# Patient Record
Sex: Female | Born: 1955 | Race: White | Hispanic: No | Marital: Married | Smoking: Never smoker
Health system: Southern US, Community
[De-identification: ages and names within clinical notes are randomized; demographics above are authoritative.]

## PROBLEM LIST (undated history)

## (undated) DIAGNOSIS — Z5189 Encounter for other specified aftercare: Secondary | ICD-10-CM

## (undated) DIAGNOSIS — I10 Essential (primary) hypertension: Secondary | ICD-10-CM

## (undated) DIAGNOSIS — E785 Hyperlipidemia, unspecified: Secondary | ICD-10-CM

## (undated) HISTORY — PX: HEMORROIDECTOMY: SUR656

## (undated) HISTORY — DX: Essential (primary) hypertension: I10

## (undated) HISTORY — DX: Hyperlipidemia, unspecified: E78.5

## (undated) HISTORY — DX: Encounter for other specified aftercare: Z51.89

---

## 1979-09-25 HISTORY — PX: ABDOMINAL HYSTERECTOMY: SHX81

## 2013-09-24 HISTORY — PX: LAPAROSCOPIC GASTRIC SLEEVE RESECTION: SHX5895

## 2016-09-24 HISTORY — PX: BREAST CYST ASPIRATION: SHX578

## 2020-03-11 LAB — HM MAMMOGRAPHY

## 2020-03-23 LAB — BASIC METABOLIC PANEL
BUN: 11 (ref 4–21)
CO2: 27 — AB (ref 13–22)
Chloride: 103 (ref 99–108)
Creatinine: 0.7 (ref 0.5–1.1)
Glucose: 84
Potassium: 4.4 (ref 3.4–5.3)
Sodium: 138 (ref 137–147)

## 2020-03-23 LAB — COMPREHENSIVE METABOLIC PANEL
Albumin: 4.1 (ref 3.5–5.0)
Calcium: 9.3 (ref 8.7–10.7)
GFR calc Af Amer: 108
GFR calc non Af Amer: 93
Globulin: 3.3

## 2020-03-23 LAB — CBC AND DIFFERENTIAL
HCT: 43 (ref 36–46)
Hemoglobin: 14.4 (ref 12.0–16.0)
WBC: 6.7

## 2020-03-23 LAB — HEPATIC FUNCTION PANEL
ALT: 23 (ref 7–35)
AST: 25 (ref 13–35)
Alkaline Phosphatase: 86 (ref 25–125)
Bilirubin, Total: 0.6

## 2020-03-23 LAB — VITAMIN B12: Vitamin B-12: 277

## 2020-03-23 LAB — TSH: TSH: 1.31 (ref 0.41–5.90)

## 2020-03-23 LAB — CBC: RBC: 4.37 (ref 3.87–5.11)

## 2020-03-23 LAB — VITAMIN D 25 HYDROXY (VIT D DEFICIENCY, FRACTURES): Vit D, 25-Hydroxy: 29

## 2020-03-24 LAB — LIPID PANEL
Cholesterol: 239 — AB (ref 0–200)
LDL Cholesterol: 145
LDl/HDL Ratio: 4.1
Triglycerides: 224 — AB (ref 40–160)

## 2020-09-06 DIAGNOSIS — Z20822 Contact with and (suspected) exposure to covid-19: Secondary | ICD-10-CM | POA: Diagnosis not present

## 2020-09-10 DIAGNOSIS — L2389 Allergic contact dermatitis due to other agents: Secondary | ICD-10-CM | POA: Diagnosis not present

## 2020-09-10 DIAGNOSIS — I10 Essential (primary) hypertension: Secondary | ICD-10-CM | POA: Diagnosis not present

## 2020-09-10 DIAGNOSIS — E785 Hyperlipidemia, unspecified: Secondary | ICD-10-CM | POA: Diagnosis not present

## 2020-09-15 ENCOUNTER — Encounter: Payer: Self-pay | Admitting: Family Medicine

## 2020-09-15 ENCOUNTER — Ambulatory Visit (INDEPENDENT_AMBULATORY_CARE_PROVIDER_SITE_OTHER): Payer: Medicare HMO | Admitting: Family Medicine

## 2020-09-15 ENCOUNTER — Other Ambulatory Visit: Payer: Self-pay

## 2020-09-15 VITALS — BP 168/98 | HR 90 | Temp 97.4°F | Ht 63.75 in | Wt 201.2 lb

## 2020-09-15 DIAGNOSIS — E782 Mixed hyperlipidemia: Secondary | ICD-10-CM | POA: Diagnosis not present

## 2020-09-15 DIAGNOSIS — L309 Dermatitis, unspecified: Secondary | ICD-10-CM | POA: Insufficient documentation

## 2020-09-15 DIAGNOSIS — I10 Essential (primary) hypertension: Secondary | ICD-10-CM | POA: Diagnosis not present

## 2020-09-15 DIAGNOSIS — Z1159 Encounter for screening for other viral diseases: Secondary | ICD-10-CM

## 2020-09-15 DIAGNOSIS — Z114 Encounter for screening for human immunodeficiency virus [HIV]: Secondary | ICD-10-CM

## 2020-09-15 DIAGNOSIS — E785 Hyperlipidemia, unspecified: Secondary | ICD-10-CM | POA: Insufficient documentation

## 2020-09-15 MED ORDER — LOSARTAN POTASSIUM 50 MG PO TABS: 50.0000 mg | ORAL_TABLET | Freq: Every day | ORAL | 3 refills | Status: DC

## 2020-09-15 MED ORDER — TRIAMCINOLONE ACETONIDE 0.1 % EX CREA
TOPICAL_CREAM | Freq: Two times a day (BID) | CUTANEOUS | 1 refills | Status: AC
Start: 2020-09-15 — End: 2020-09-30

## 2020-09-15 MED ORDER — ATORVASTATIN CALCIUM 20 MG PO TABS: 20.0000 mg | ORAL_TABLET | Freq: Every day | ORAL | 3 refills | Status: DC

## 2020-09-15 NOTE — Progress Notes (Signed)
Subjective:     Evelyn Bell is a 64 y.o. female presenting for Establish Care     HPI   #Rash - [facility] - thought it was shingles - started Dec 11 - went to [facility] on Dec 18 - one small welt and spread everywhere - took oral steroids  - using topical steroids - triamcinolone - was itchy and prescribed hydroxyzine   #HTN - has been out of medications x 2 months - feeling fine - no cp, ha, dizziness, vision changes - before she ran out of medication was running 145/84 - restarted Losartan 50 mg last week -     Review of Systems   Social History   Tobacco Use  Smoking Status Never Smoker  Smokeless Tobacco Never Used        Objective:    BP Readings from Last 3 Encounters:  09/15/20 (!) 168/98   Wt Readings from Last 3 Encounters:  09/15/20 201 lb 4 oz (91.3 kg)    BP (!) 168/98   Pulse 90   Temp (!) 97.4 F (36.3 C) (Temporal)   Ht 5' 3.75" (1.619 m)   Wt 201 lb 4 oz (91.3 kg)   SpO2 99%   BMI 34.82 kg/m    Physical Exam Constitutional:      General: She is not in acute distress.    Appearance: She is well-developed. She is not diaphoretic.  HENT:     Right Ear: External ear normal.     Left Ear: External ear normal.     Nose: Nose normal.  Eyes:     Conjunctiva/sclera: Conjunctivae normal.  Cardiovascular:     Rate and Rhythm: Normal rate and regular rhythm.     Heart sounds: No murmur heard.   Pulmonary:     Effort: Pulmonary effort is normal. No respiratory distress.     Breath sounds: Normal breath sounds. No wheezing.  Musculoskeletal:     Cervical back: Neck supple.  Skin:    General: Skin is warm and dry.     Capillary Refill: Capillary refill takes less than 2 seconds.     Comments: Several erythematous macular lesions on the trunk.   Neurological:     Mental Status: She is alert. Mental status is at baseline.  Psychiatric:        Mood and Affect: Mood normal.        Behavior: Behavior normal.            Assessment & Plan:   Problem List Items Addressed This Visit      Cardiovascular and Mediastinum   Hypertension - Primary    BP elevated in setting of steroids and being out of medication. Home monitoring off steroids and mychart in 2 weeks. Anticipate increasing losartan and possibly adding amlodipine if needed. Blood work fasting next week. Cont losartan 50 mg.       Relevant Medications   atorvastatin (LIPITOR) 20 MG tablet   losartan (COZAAR) 50 MG tablet   Other Relevant Orders   Comprehensive metabolic panel   TSH   CBC     Musculoskeletal and Integument   Dermatitis    Resolving dermatitis. Refill triamcinolone if needed.       Relevant Medications   triamcinolone (KENALOG) 0.1 %     Other   Hyperlipidemia    Cont atorvastatin 20 mg. Return for fasting labs.       Relevant Medications   atorvastatin (LIPITOR) 20 MG tablet  losartan (COZAAR) 50 MG tablet   Other Relevant Orders   Lipid panel    Other Visit Diagnoses    Need for hepatitis C screening test       Relevant Orders   Hepatitis C antibody   Screening for HIV (human immunodeficiency virus)       Relevant Orders   HIV Antibody (routine testing w rflx)       Return in about 2 months (around 11/16/2020).  Lynnda Child, MD  This visit occurred during the SARS-CoV-2 public health emergency.  Safety protocols were in place, including screening questions prior to the visit, additional usage of staff PPE, and extensive cleaning of exam room while observing appropriate contact time as indicated for disinfecting solutions.

## 2020-09-15 NOTE — Assessment & Plan Note (Signed)
Cont atorvastatin 20 mg. Return for fasting labs.

## 2020-09-15 NOTE — Assessment & Plan Note (Signed)
Resolving dermatitis. Refill triamcinolone if needed.

## 2020-09-15 NOTE — Assessment & Plan Note (Addendum)
BP elevated in setting of steroids and being out of medication. Home monitoring off steroids and mychart in 2 weeks. Anticipate increasing losartan and possibly adding amlodipine if needed. Blood work fasting next week. Cont losartan 50 mg.

## 2020-09-15 NOTE — Patient Instructions (Addendum)
#   Rash - continue current treatment - if rash flares up - try topical for 1 week week and if no improvement make follow-up appointment  #Blood pressure - Continue losartan - Mychart in 2 weeks with blood pressure readings    Your blood pressure high.   High blood pressure increases your risk for heart attack and stroke.    Please check your blood pressure 2-4 times a week.   To check your blood pressure 1) Sit in a quiet and relaxed place for 5 minutes 2) Make sure your feet are flat on the ground 3) Consider checking first thing in the morning   Normal blood pressure is less than 140/90 Ideally you blood pressure should be around 120/80  Other ways you can reduce your blood pressure:  1) Regular exercise -- Try to get 150 minutes (30 minutes, 5 days a week) of moderate to vigorous aerobic excercise -- Examples: brisk walking (2.5 miles per hour), water aerobics, dancing, gardening, tennis, biking slower than 10 miles per hour 2) DASH Diet - low fat meats, more fresh fruits and vegetables, whole grains, low salt 3) Quit smoking if you smoke 4) Loose 5-10% of your body weight

## 2020-09-29 ENCOUNTER — Other Ambulatory Visit: Payer: Self-pay

## 2020-09-29 ENCOUNTER — Other Ambulatory Visit (INDEPENDENT_AMBULATORY_CARE_PROVIDER_SITE_OTHER): Payer: Medicare HMO

## 2020-09-29 DIAGNOSIS — Z1159 Encounter for screening for other viral diseases: Secondary | ICD-10-CM

## 2020-09-29 DIAGNOSIS — Z114 Encounter for screening for human immunodeficiency virus [HIV]: Secondary | ICD-10-CM

## 2020-09-29 DIAGNOSIS — I10 Essential (primary) hypertension: Secondary | ICD-10-CM | POA: Diagnosis not present

## 2020-09-29 DIAGNOSIS — E782 Mixed hyperlipidemia: Secondary | ICD-10-CM | POA: Diagnosis not present

## 2020-09-29 LAB — CBC
HCT: 42.7 % (ref 36.0–46.0)
Hemoglobin: 14.7 g/dL (ref 12.0–15.0)
MCHC: 34.3 g/dL (ref 30.0–36.0)
MCV: 95.9 fl (ref 78.0–100.0)
Platelets: 216 10*3/uL (ref 150.0–400.0)
RBC: 4.45 Mil/uL (ref 3.87–5.11)
RDW: 14.4 % (ref 11.5–15.5)
WBC: 6.4 10*3/uL (ref 4.0–10.5)

## 2020-09-29 LAB — COMPREHENSIVE METABOLIC PANEL
ALT: 40 U/L — ABNORMAL HIGH (ref 0–35)
AST: 34 U/L (ref 0–37)
Albumin: 4.2 g/dL (ref 3.5–5.2)
Alkaline Phosphatase: 76 U/L (ref 39–117)
BUN: 9 mg/dL (ref 6–23)
CO2: 28 mEq/L (ref 19–32)
Calcium: 9.7 mg/dL (ref 8.4–10.5)
Chloride: 104 mEq/L (ref 96–112)
Creatinine, Ser: 0.69 mg/dL (ref 0.40–1.20)
GFR: 91.32 mL/min (ref >60.00)
Glucose, Bld: 97 mg/dL (ref 70–99)
Potassium: 4.2 mEq/L (ref 3.5–5.1)
Sodium: 138 mEq/L (ref 135–145)
Total Bilirubin: 0.8 mg/dL (ref 0.2–1.2)
Total Protein: 7.2 g/dL (ref 6.0–8.3)

## 2020-09-29 LAB — LIPID PANEL
Cholesterol: 224 mg/dL — ABNORMAL HIGH (ref 0–200)
HDL: 45.5 mg/dL (ref >39.00)
LDL Cholesterol: 147 mg/dL — ABNORMAL HIGH (ref 0–99)
NonHDL: 178.21
Total CHOL/HDL Ratio: 5
Triglycerides: 157 mg/dL — ABNORMAL HIGH (ref 0.0–149.0)
VLDL: 31.4 mg/dL (ref 0.0–40.0)

## 2020-09-29 LAB — TSH: TSH: 2.33 u[IU]/mL (ref 0.35–4.50)

## 2020-09-30 ENCOUNTER — Encounter: Payer: Self-pay | Admitting: Family Medicine

## 2020-09-30 LAB — HEPATITIS C ANTIBODY
Hepatitis C Ab: NONREACTIVE
SIGNAL TO CUT-OFF: 0.01 (ref <1.00)

## 2020-09-30 LAB — HIV ANTIBODY (ROUTINE TESTING W REFLEX): HIV 1&2 Ab, 4th Generation: NONREACTIVE

## 2020-11-21 ENCOUNTER — Ambulatory Visit (INDEPENDENT_AMBULATORY_CARE_PROVIDER_SITE_OTHER): Payer: Medicare HMO | Admitting: Family Medicine

## 2020-11-21 ENCOUNTER — Other Ambulatory Visit: Payer: Self-pay

## 2020-11-21 ENCOUNTER — Encounter: Payer: Self-pay | Admitting: Family Medicine

## 2020-11-21 VITALS — BP 130/90 | HR 81 | Temp 97.6°F | Ht 64.0 in | Wt 204.5 lb

## 2020-11-21 DIAGNOSIS — E782 Mixed hyperlipidemia: Secondary | ICD-10-CM

## 2020-11-21 DIAGNOSIS — L309 Dermatitis, unspecified: Secondary | ICD-10-CM

## 2020-11-21 DIAGNOSIS — I10 Essential (primary) hypertension: Secondary | ICD-10-CM | POA: Insufficient documentation

## 2020-11-21 NOTE — Assessment & Plan Note (Signed)
Good control at home 120-130s/84. Suspect whitecoat HTN. Cont losartan 50 mg.

## 2020-11-21 NOTE — Patient Instructions (Addendum)
Rash - continue triamcinolone cream - can use for 2-3 weeks - could try Clobetasol if not responding - dermatology referral  #Referral I have placed a referral to a specialist for you. You should receive a phone call from the specialty office. Make sure your voicemail is not full and that if you are able to answer your phone to unknown or new numbers.   It may take up to 2 weeks to hear about the referral. If you do not hear anything in 2 weeks, please call our office and ask to speak with the referral coordinator.

## 2020-11-21 NOTE — Assessment & Plan Note (Signed)
Recently restarted lipitor 20mg . Will recheck labs at next visit.

## 2020-11-21 NOTE — Assessment & Plan Note (Signed)
Cont current medications given good home bp.

## 2020-11-21 NOTE — Assessment & Plan Note (Signed)
Continue triamcinolone. Previously needed steroids but minimal lesions currently. Due to recurrence w/o cause will refer to dermatology for support if not resolving with topical treatment.

## 2020-11-21 NOTE — Progress Notes (Signed)
Subjective:     Evelyn Bell is a 65 y.o. female presenting for Follow-up (2 month-HT ) and Rash (Upper body )     HPI  #HTN - has been checking at home 120-130/84 - is taking losartan - no ha/vision changes   #HLD - had been out of cholesterol medication - tolerating lipitor  #Rash - has returned - is "all over"  - arms, face, abdomen -   Review of Systems  09/15/2020: Clinic - HTN on steroids and out of meds. Home monitoring 09/30/2020: MyChart - improved on current medications  Social History   Tobacco Use  Smoking Status Never Smoker  Smokeless Tobacco Never Used        Objective:    BP Readings from Last 3 Encounters:  11/21/20 130/90  09/15/20 (!) 168/98   Wt Readings from Last 3 Encounters:  11/21/20 204 lb 8 oz (92.8 kg)  09/15/20 201 lb 4 oz (91.3 kg)    BP 130/90   Pulse 81   Temp 97.6 F (36.4 C) (Temporal)   Ht 5\' 4"  (1.626 m)   Wt 204 lb 8 oz (92.8 kg)   SpO2 99%   BMI 35.10 kg/m    Physical Exam Constitutional:      General: She is not in acute distress.    Appearance: She is well-developed. She is not diaphoretic.  HENT:     Right Ear: External ear normal.     Left Ear: External ear normal.  Eyes:     Conjunctiva/sclera: Conjunctivae normal.  Cardiovascular:     Rate and Rhythm: Normal rate and regular rhythm.     Heart sounds: No murmur heard.   Pulmonary:     Effort: Pulmonary effort is normal. No respiratory distress.     Breath sounds: Normal breath sounds. No wheezing.  Musculoskeletal:     Cervical back: Neck supple.  Skin:    General: Skin is warm and dry.     Capillary Refill: Capillary refill takes less than 2 seconds.     Comments: Raised, erythematous plaques with scale on the abdomen and some scattered papules on the arms.   Neurological:     Mental Status: She is alert. Mental status is at baseline.  Psychiatric:        Mood and Affect: Mood normal.        Behavior: Behavior normal.            Assessment & Plan:   Problem List Items Addressed This Visit      Cardiovascular and Mediastinum   Hypertension - Primary    Good control at home 120-130s/84. Suspect whitecoat HTN. Cont losartan 50 mg.       White coat syndrome with diagnosis of hypertension    Cont current medications given good home bp.         Musculoskeletal and Integument   Dermatitis    Continue triamcinolone. Previously needed steroids but minimal lesions currently. Due to recurrence w/o cause will refer to dermatology for support if not resolving with topical treatment.       Relevant Orders   Ambulatory referral to Dermatology     Other   Hyperlipidemia    Recently restarted lipitor 20mg . Will recheck labs at next visit.          Return in about 6 months (around 05/21/2021) for welcome to medicare.  , MD  This visit occurred during the SARS-CoV-2 public health emergency.  Safety protocols were in place,  including screening questions prior to the visit, additional usage of staff PPE, and extensive cleaning of exam room while observing appropriate contact time as indicated for disinfecting solutions.

## 2020-12-12 ENCOUNTER — Ambulatory Visit: Payer: Medicare HMO | Admitting: Dermatology

## 2020-12-12 ENCOUNTER — Other Ambulatory Visit: Payer: Self-pay

## 2020-12-12 DIAGNOSIS — L509 Urticaria, unspecified: Secondary | ICD-10-CM

## 2020-12-12 DIAGNOSIS — R232 Flushing: Secondary | ICD-10-CM | POA: Diagnosis not present

## 2020-12-12 NOTE — Progress Notes (Signed)
   New Patient Visit  Subjective  Evelyn Bell is a 65 y.o. female who presents for the following: Dermatitis (Patient moved here in October patient states in December 11 she noticed a welt on left hip area and then spread to bilateral hip and waist area. Patient states she has had rash on chest, hands, arms, waist, thigh, abdomen and inguinal folds. Patient states she has been to urgent care and was given pill and triamcinolone cream 0.1 %. Patient states cream only helps on welt. She states rash last for about 2 weeks and comes back. ).  Patient denies using any new foods, meds, pets, detergents, creams, and lotion  Objective  Well appearing patient in no apparent distress; mood and affect are within normal limits.  A focused examination was performed including chest and abdomen. Relevant physical exam findings are noted in the Assessment and Plan.  Objective  abdomen: Clear today   Objective  chest: Redness and flush   Assessment & Plan  Hives /urticaria abdomen May be persistent or recurrent.  Patient is currently clear today.  She has had only a few episodes over the last few months.    Pamphlet on hives/urticaria given in office   Discussed factors that can cause hives Hives are generally idiopathic; possibly related to stress, infection, medications, foods, sun, heat, cold or viruses.  Advised that 70% of the time we do not find out the exact cause.  Reviewed patient's labs:  tsh, cmp, cbc, and lipids. All normal except elevated cholesterol.  I do not recommend any further laboratory at this time.  Discussed recommended treatment options as Allergra or Claritin. Sometimes we will add oral Singulair or systemic if needed. We can also consider Xolair injections if resistant to other treatment options. Because she is currently clear would recommend starting with recurrence.  No treatment today - patient is not currently experiencing rash  Recommend starting if  rash/hives reoccur allegra 180 mg - 1 tab qd if symptoms still persist can increase to 2 tablets qd. If hive/rash still unresolved call and schedule an appointment.   Flushing chest No treatment recommended at this time. May consider Rhofade or Mirvaso in the future.  Return if symptoms worsen or fail to improve.  IAsher Muir, CMA, am acting as scribe for Armida Sans, MD.  Documentation: I have reviewed the above documentation for accuracy and completeness, and I agree with the above.  Armida Sans, MD

## 2020-12-12 NOTE — Patient Instructions (Addendum)
If hive/rash reoccurs start Allegra 180 mg take 1 by mouth daily. If symptoms still persist increase to 2 tablets by mouth daily. If symptoms still persist call and schedule an appointment.   If you have any questions or concerns for your doctor, please call our main line at [phone] and press option 4 to reach your doctor's medical assistant. If no one answers, please leave a voicemail as directed and we will return your call as soon as possible. Messages left after 4 pm will be answered the following business day.   You may also send [place] a message via MyChart. We typically respond to MyChart messages within 1-2 business days.  For prescription refills, please ask your pharmacy to contact our office. Our fax number is [phone].  If you have an urgent issue when the clinic is closed that cannot wait until the next business day, you can page your doctor at the number below.    Please note that while we do our best to be available for urgent issues outside of office hours, we are not available 24/7.   If you have an urgent issue and are unable to reach [place], you may choose to seek medical care at your doctor's office, retail clinic, urgent care center, or emergency room.  If you have a medical emergency, please immediately call 911 or go to the emergency department.  Pager Numbers  - Dr. Gwen Pounds: [phone]  - Dr. Neale Burly: [phone]  - Dr. Roseanne Reno: [phone]  In the event of inclement weather, please call our main line at [phone] for an update on the status of any delays or closures.  Dermatology Medication Tips: Please keep the boxes that topical medications come in in order to help keep track of the instructions about where and how to use these. Pharmacies typically print the medication instructions only on the boxes and not directly on the medication tubes.   If your medication is too expensive, please contact our office at [phone] option 4 or send [place] a message  through MyChart.   We are unable to tell what your co-pay for medications will be in advance as this is different depending on your insurance coverage. However, we may be able to find a substitute medication at lower cost or fill out paperwork to get insurance to cover a needed medication.   If a prior authorization is required to get your medication covered by your insurance company, please allow [place] 1-2 business days to complete this process.  Drug prices often vary depending on where the prescription is filled and some pharmacies may offer cheaper prices.  The website www.goodrx.com contains coupons for medications through different pharmacies. The prices here do not account for what the cost may be with help from insurance (it may be cheaper with your insurance), but the website can give you the price if you did not use any insurance.  - You can print the associated coupon and take it with your prescription to the pharmacy.  - You may also stop by our office during regular business hours and pick up a GoodRx coupon card.  - If you need your prescription sent electronically to a different pharmacy, notify our office through [facility] or by phone at [phone] option 4.

## 2020-12-14 ENCOUNTER — Encounter: Payer: Self-pay | Admitting: Dermatology

## 2021-01-19 ENCOUNTER — Telehealth: Payer: Self-pay

## 2021-01-19 NOTE — Telephone Encounter (Signed)
Patient called regarding rash that Dr. Gwen Pounds seen last month at office visit. Patient states rash is now back in full force. She is unsure what to do at this point. Please advise.

## 2021-01-19 NOTE — Telephone Encounter (Signed)
Pt with history of HIVES.   She may start Allegra 180 mg qd.  If not improved in 2 days, take Allegra 180 mg TWO pills daily. Allegra is non-prescription. If still not doing well after a week, we can send in Rx for Singulair - and pt needs appt scheduled at same time. Rx Xolair shots an option is still not doing well. May do lab with Alpha-Gal also.  Pts other lab done were OK.  Please call and advise pt.

## 2021-01-20 NOTE — Telephone Encounter (Signed)
Patient advised of information per Dr. Gwen Pounds. She started taking Allegra twice daily yesterday (Thursday) and will continue through the weekend. Dr. Gwen Pounds had opening on Monday at 8:45. Patient is scheduled to come in then.

## 2021-01-23 ENCOUNTER — Other Ambulatory Visit: Payer: Self-pay

## 2021-01-23 ENCOUNTER — Ambulatory Visit: Payer: Medicare HMO | Admitting: Dermatology

## 2021-01-23 DIAGNOSIS — L509 Urticaria, unspecified: Secondary | ICD-10-CM

## 2021-01-23 DIAGNOSIS — L501 Idiopathic urticaria: Secondary | ICD-10-CM

## 2021-01-23 MED ORDER — MONTELUKAST SODIUM 10 MG PO TABS: 10.0000 mg | ORAL_TABLET | Freq: Every day | ORAL | 1 refills | Status: DC

## 2021-01-23 NOTE — Patient Instructions (Signed)

## 2021-01-23 NOTE — Progress Notes (Signed)
   Follow-Up Visit   Subjective  Evelyn Bell is a 65 y.o. female who presents for the following: Rash (Started at least 6 weeks ago, but flared  6 days ago - patient used TMC 0.1% cream BID x 6 days, but stopped using it because she didn't see an improvement in condition. Currently she is using Allegra 180mg  po BID. She has noticed that each time she gets hives she is sick prior to rash occurring.). Pt seen in March 2022 for hives, but was doing OK at the time.  Now flared. Lab reviewed from January 2022 and all OK.  The following portions of the chart were reviewed this encounter and updated as appropriate:   Tobacco  Allergies  Meds  Problems  Med Hx  Surg Hx  Fam Hx     Review of Systems:  No other skin or systemic complaints except as noted in HPI or Assessment and Plan.  Objective  Well appearing patient in no apparent distress; mood and affect are within normal limits.  A focused examination was performed including the trunk and extremities . Relevant physical exam findings are noted in the Assessment and Plan.  Objective  Trunk: Large urticarial plaques on the abdomen.  Images            Assessment & Plan  Urticaria -chronic idiopathic urticaria present for approximately 2 months flared today Trunk Chronic and persistent - patient was recently sick, discussed with patient that physical and emotional stress can be a cause of hives. We also discussed that urticaria can also have no known cause.   Continue Allegra 180mg  po BID. Start Singulair 10mg  po QD. Consider Xolair if not improving with treatment. Patient to contact our office in one week to report if condition improving with Singulair.   Recommend patient following up with her PCP to r/o sinus infection vs other sickness which could be contributing her to hives.   Lab from January 2022 OK - CBC; Chem; TSH; HIV; HepatitisC.  Order lab ordered today:  Alpha Gal IgE - urticaria  montelukast  (SINGULAIR) 10 MG tablet - Trunk  Return in about 1 month (around 02/23/2021).  , CMA, am acting as scribe for February 2022, MD .  Documentation: I have reviewed the above documentation for accuracy and completeness, and I agree with the above.  04/25/2021, MD

## 2021-01-25 ENCOUNTER — Other Ambulatory Visit: Payer: Self-pay

## 2021-01-25 ENCOUNTER — Ambulatory Visit (INDEPENDENT_AMBULATORY_CARE_PROVIDER_SITE_OTHER): Payer: Medicare HMO | Admitting: Family Medicine

## 2021-01-25 VITALS — BP 148/100 | HR 109 | Temp 97.1°F | Ht 64.0 in | Wt 210.5 lb

## 2021-01-25 DIAGNOSIS — J069 Acute upper respiratory infection, unspecified: Secondary | ICD-10-CM | POA: Diagnosis not present

## 2021-01-25 DIAGNOSIS — I10 Essential (primary) hypertension: Secondary | ICD-10-CM

## 2021-01-25 MED ORDER — BENZONATATE 100 MG PO CAPS: 100.0000 mg | ORAL_CAPSULE | Freq: Three times a day (TID) | ORAL | 0 refills | Status: DC | PRN

## 2021-01-25 NOTE — Progress Notes (Signed)
Subjective:     Evelyn Bell is a 65 y.o. female presenting for Cough (Semi productive. Home covid test 3 days ago) and Urticaria (Over most of the body )     HPI   #cough - cold symptoms - took a covid test which was negative - has had 3 episodes of hives - had resolved but came back with her cold symptoms - negative covid test 3 days ago - no loss of taste or smell - no hx of covid, vaccinated - no sick contact - coughing with rattle - endorses sinus drainage and congestion - symptoms started 4/24 with tickle in throat - on allergy medication w/o improvement  - no sinus pressure or pain - dripping and throat tickle - dry cough  #HTN - blood pressure has been normal at home   Review of Systems  Respiratory: Negative for shortness of breath and wheezing.      Social History   Tobacco Use  Smoking Status Never Smoker  Smokeless Tobacco Never Used        Objective:    BP Readings from Last 3 Encounters:  01/25/21 (!) 148/100  11/21/20 130/90  09/15/20 (!) 168/98   Wt Readings from Last 3 Encounters:  01/25/21 210 lb 8 oz (95.5 kg)  11/21/20 204 lb 8 oz (92.8 kg)  09/15/20 201 lb 4 oz (91.3 kg)    BP (!) 148/100   Pulse (!) 109   Temp (!) 97.1 F (36.2 C) (Temporal)   Ht 5\' 4"  (1.626 m)   Wt 210 lb 8 oz (95.5 kg)   SpO2 100%   BMI 36.13 kg/m    Physical Exam Constitutional:      General: She is not in acute distress.    Appearance: She is well-developed. She is not diaphoretic.  HENT:     Head: Normocephalic and atraumatic.     Right Ear: Tympanic membrane and ear canal normal.     Left Ear: Tympanic membrane and ear canal normal.     Nose: Mucosal edema and rhinorrhea present.     Right Sinus: No maxillary sinus tenderness or frontal sinus tenderness.     Left Sinus: No maxillary sinus tenderness or frontal sinus tenderness.     Mouth/Throat:     Pharynx: Uvula midline. Posterior oropharyngeal erythema present. No oropharyngeal  exudate.     Tonsils: 0 on the right. 0 on the left.  Eyes:     General: No scleral icterus.    Conjunctiva/sclera: Conjunctivae normal.  Cardiovascular:     Rate and Rhythm: Normal rate and regular rhythm.     Heart sounds: Normal heart sounds. No murmur heard.   Pulmonary:     Effort: Pulmonary effort is normal. No respiratory distress.     Breath sounds: Normal breath sounds.  Musculoskeletal:     Cervical back: Neck supple.  Lymphadenopathy:     Cervical: No cervical adenopathy.  Skin:    General: Skin is warm and dry.     Capillary Refill: Capillary refill takes less than 2 seconds.  Neurological:     Mental Status: She is alert.           Assessment & Plan:   Problem List Items Addressed This Visit      Cardiovascular and Mediastinum   Hypertension    Normal bp at home. Cont losartan 50 mg.       White coat syndrome with diagnosis of hypertension    Normal at home. Cont current  treatment       Other Visit Diagnoses    Viral URI with cough    -  Primary   Relevant Medications   benzonatate (TESSALON PERLES) 100 MG capsule     Symptomatic care for virus. Discussed cough can last up to 6 weeks. Return if fever, chills, worsening face/sinus pain.    Return if symptoms worsen or fail to improve.  Lynnda Child, MD  This visit occurred during the SARS-CoV-2 public health emergency.  Safety protocols were in place, including screening questions prior to the visit, additional usage of staff PPE, and extensive cleaning of exam room while observing appropriate contact time as indicated for disinfecting solutions.

## 2021-01-25 NOTE — Assessment & Plan Note (Signed)
Normal bp at home. Cont losartan 50 mg.

## 2021-01-25 NOTE — Assessment & Plan Note (Signed)
Normal at home. Cont current treatment

## 2021-01-25 NOTE — Patient Instructions (Signed)
Based on your symptoms, it looks like you have a virus.   Antibiotics are not need for a viral infection but the following will help:   1. Drink plenty of fluids 2. Get lots of rest  Sinus Congestion 1) Neti Pot (Saline rinse) -- 2 times day -- if tolerated 2) Flonase (Store Brand ok) - once daily 3) Over the counter congestion medications  Cough 1) Cough drops can be helpful 2) Nyquil (or nighttime cough medication) 3) Honey is proven to be one of the best cough medications  4) Cough medicine with Dextromethorphan can also be helpful  Sore Throat 1) Honey as above, cough drops 2) Ibuprofen or Aleve can be helpful 3) Salt water Gargles  If you develop fevers (Temperature >100.4), chills, worsening symptoms or symptoms lasting longer than 10 days return to clinic.    

## 2021-01-26 ENCOUNTER — Encounter: Payer: Self-pay | Admitting: Dermatology

## 2021-01-27 ENCOUNTER — Other Ambulatory Visit: Payer: Self-pay | Admitting: Dermatology

## 2021-01-27 DIAGNOSIS — L509 Urticaria, unspecified: Secondary | ICD-10-CM | POA: Diagnosis not present

## 2021-02-04 LAB — ALPHA-GAL PANEL
Allergen Lamb IgE: <0.1 kU/L
Beef IgE: <0.1 kU/L
IgE (Immunoglobulin E), Serum: 24 IU/mL (ref 6–495)
O215-IgE Alpha-Gal: <0.1 kU/L
Pork IgE: <0.1 kU/L

## 2021-02-08 ENCOUNTER — Telehealth: Payer: Self-pay

## 2021-02-08 NOTE — Telephone Encounter (Signed)
Advised patient of results/hd  

## 2021-02-08 NOTE — Telephone Encounter (Signed)
-----   Message from Deirdre Evener, MD sent at 02/06/2021  6:04 PM EDT ----- Alpha-Gal and IgE testing from May 2022 all NORMAL / Negative

## 2021-02-15 ENCOUNTER — Other Ambulatory Visit: Payer: Self-pay | Admitting: Dermatology

## 2021-02-15 DIAGNOSIS — L509 Urticaria, unspecified: Secondary | ICD-10-CM

## 2021-03-01 ENCOUNTER — Other Ambulatory Visit: Payer: Self-pay

## 2021-03-01 ENCOUNTER — Ambulatory Visit (INDEPENDENT_AMBULATORY_CARE_PROVIDER_SITE_OTHER): Payer: Medicare HMO | Admitting: Dermatology

## 2021-03-01 DIAGNOSIS — L509 Urticaria, unspecified: Secondary | ICD-10-CM | POA: Diagnosis not present

## 2021-03-01 MED ORDER — MONTELUKAST SODIUM 10 MG PO TABS: ORAL_TABLET | ORAL | 1 refills | Status: DC

## 2021-03-01 NOTE — Progress Notes (Signed)
   Follow-Up Visit   Subjective  Evelyn Bell is a 65 y.o. female who presents for the following: Follow-up (Urticaria 1 month follow up - Allegra 2 po qd, Singulair 1 po qd - has not had a flared since her last visit.).  The following portions of the chart were reviewed this encounter and updated as appropriate:   Tobacco  Allergies  Meds  Problems  Med Hx  Surg Hx  Fam Hx      Review of Systems:  No other skin or systemic complaints except as noted in HPI or Assessment and Plan.  Objective  Well appearing patient in no apparent distress; mood and affect are within normal limits.  A focused examination was performed including trunk, extremities. Relevant physical exam findings are noted in the Assessment and Plan.    Assessment & Plan  Urticaria Trunk Chronic and persistent  But better controlled without flares since last visit  Recommend she continue Allegra 2 po qd and Singulair 1 po qd for at least 2 more weeks before trying to ween off as long as she is staying clear.  Discussed labs - advised patient Alpha-Gal and IgE testing from May 2022 all NORMAL / Negative. Discussed labs for Lyme Disease - may consider in the future. May consider Xolair in the future if condition is not staying controlled.  montelukast (SINGULAIR) 10 MG tablet - Trunk TAKE 1 TABLET BY MOUTH EVERYDAY AT BEDTIME  Return if symptoms worsen or fail to improve.   I, Joanie Coddington, CMA, am acting as scribe for Armida Sans, MD .  Documentation: I have reviewed the above documentation for accuracy and completeness, and I agree with the above.  Armida Sans, MD

## 2021-03-01 NOTE — Patient Instructions (Signed)

## 2021-03-03 ENCOUNTER — Encounter: Payer: Self-pay | Admitting: Dermatology

## 2021-03-15 ENCOUNTER — Other Ambulatory Visit: Payer: Self-pay | Admitting: Dermatology

## 2021-03-15 DIAGNOSIS — L509 Urticaria, unspecified: Secondary | ICD-10-CM

## 2021-04-07 ENCOUNTER — Other Ambulatory Visit: Payer: Self-pay | Admitting: Dermatology

## 2021-04-07 DIAGNOSIS — L509 Urticaria, unspecified: Secondary | ICD-10-CM

## 2021-05-05 ENCOUNTER — Other Ambulatory Visit: Payer: Self-pay | Admitting: Dermatology

## 2021-05-05 DIAGNOSIS — L509 Urticaria, unspecified: Secondary | ICD-10-CM

## 2021-05-22 ENCOUNTER — Other Ambulatory Visit: Payer: Self-pay

## 2021-05-22 ENCOUNTER — Ambulatory Visit (INDEPENDENT_AMBULATORY_CARE_PROVIDER_SITE_OTHER): Payer: Medicare HMO | Admitting: Family Medicine

## 2021-05-22 VITALS — BP 138/82 | HR 72 | Temp 98.0°F | Ht 64.5 in | Wt 212.2 lb

## 2021-05-22 DIAGNOSIS — Z23 Encounter for immunization: Secondary | ICD-10-CM

## 2021-05-22 DIAGNOSIS — I10 Essential (primary) hypertension: Secondary | ICD-10-CM | POA: Diagnosis not present

## 2021-05-22 DIAGNOSIS — Z0000 Encounter for general adult medical examination without abnormal findings: Secondary | ICD-10-CM | POA: Diagnosis not present

## 2021-05-22 DIAGNOSIS — E782 Mixed hyperlipidemia: Secondary | ICD-10-CM | POA: Diagnosis not present

## 2021-05-22 LAB — COMPREHENSIVE METABOLIC PANEL
ALT: 24 U/L (ref 0–35)
AST: 21 U/L (ref 0–37)
Albumin: 4 g/dL (ref 3.5–5.2)
Alkaline Phosphatase: 68 U/L (ref 39–117)
BUN: 8 mg/dL (ref 6–23)
CO2: 27 mEq/L (ref 19–32)
Calcium: 9.6 mg/dL (ref 8.4–10.5)
Chloride: 105 mEq/L (ref 96–112)
Creatinine, Ser: 0.68 mg/dL (ref 0.40–1.20)
GFR: 91.23 mL/min (ref >60.00)
Glucose, Bld: 91 mg/dL (ref 70–99)
Potassium: 4.6 mEq/L (ref 3.5–5.1)
Sodium: 138 mEq/L (ref 135–145)
Total Bilirubin: 0.6 mg/dL (ref 0.2–1.2)
Total Protein: 7.5 g/dL (ref 6.0–8.3)

## 2021-05-22 LAB — LIPID PANEL
Cholesterol: 153 mg/dL (ref 0–200)
HDL: 39.7 mg/dL (ref >39.00)
LDL Cholesterol: 82 mg/dL (ref 0–99)
NonHDL: 112.91
Total CHOL/HDL Ratio: 4
Triglycerides: 153 mg/dL — ABNORMAL HIGH (ref 0.0–149.0)
VLDL: 30.6 mg/dL (ref 0.0–40.0)

## 2021-05-22 NOTE — Progress Notes (Signed)
Subjective:    Evelyn Bell is a 65 y.o. female who presents for a Welcome to Medicare exam.   Review of Systems Review of Systems  Constitutional:  Negative for chills and fever.  HENT:  Negative for congestion and sore throat.   Eyes:  Negative for blurred vision and double vision.  Respiratory:  Negative for shortness of breath.   Cardiovascular:  Negative for chest pain.  Gastrointestinal:  Negative for heartburn, nausea and vomiting.  Genitourinary: Negative.   Musculoskeletal: Negative.  Negative for myalgias.  Skin:  Negative for rash.  Neurological:  Negative for dizziness and headaches.  Endo/Heme/Allergies:  Does not bruise/bleed easily.  Psychiatric/Behavioral:  Negative for depression. The patient is not nervous/anxious.    Cardiac Risk Factors include: advanced age (>78men, >69 women);dyslipidemia;hypertension      Objective:    Today's Vitals   05/22/21 0846  BP: 138/82  Pulse: 72  Temp: 98 F (36.7 C)  TempSrc: Temporal  SpO2: 99%  Weight: 212 lb 4 oz (96.3 kg)  Height: 5' 4.5" (1.638 m)  Body mass index is 35.87 kg/m.  Medications Outpatient Encounter Medications as of 05/22/2021  Medication Sig   atorvastatin (LIPITOR) 20 MG tablet Take 1 tablet (20 mg total) by mouth daily.   Cholecalciferol (VITAMIN D-3) 25 MCG (1000 UT) CAPS Take 1,000 Units by mouth daily.   cyanocobalamin 1000 MCG tablet Take 1,000 mcg by mouth daily.   fexofenadine (ALLEGRA) 180 MG tablet Take 180 mg by mouth 2 (two) times daily.   losartan (COZAAR) 50 MG tablet Take 1 tablet (50 mg total) by mouth daily.   montelukast (SINGULAIR) 10 MG tablet TAKE 1 TABLET BY MOUTH EVERYDAY AT BEDTIME   [DISCONTINUED] benzonatate (TESSALON PERLES) 100 MG capsule Take 1 capsule (100 mg total) by mouth 3 (three) times daily as needed for cough.   No facility-administered encounter medications on file as of 05/22/2021.     History: Past Medical History:  Diagnosis Date   Blood  transfusion without reported diagnosis    Hyperlipidemia    Hypertension    Past Surgical History:  Procedure Laterality Date   ABDOMINAL HYSTERECTOMY     CESAREAN SECTION     LAPAROSCOPIC GASTRIC SLEEVE RESECTION  2015    Family History  Problem Relation Age of Onset   Heart disease Mother    Valvular heart disease Mother        passed away surgical complications   Heart disease Sister    Social History   Occupational History   Not on file  Tobacco Use   Smoking status: Never   Smokeless tobacco: Never  Vaping Use   Vaping Use: Never used  Substance and Sexual Activity   Alcohol use: Yes    Comment: 1-2 glasses daily   Drug use: Never   Sexual activity: Yes    Birth control/protection: Surgical    Tobacco Counseling Counseling given: Not Answered   Immunizations and Health Maintenance Immunization History  Administered Date(s) Administered   Influenza,inj,Quad PF,6+ Mos 05/27/2020   PFIZER(Purple Top)SARS-COV-2 Vaccination 12/11/2019, 01/01/2020, 09/12/2020, 02/15/2021   Health Maintenance Due  Topic Date Due   TETANUS/TDAP  Never done   COLONOSCOPY (Pts 45-74yrs Insurance coverage will need to be confirmed)  Never done   MAMMOGRAM  Never done   Zoster Vaccines- Shingrix (1 of 2) Never done   DEXA SCAN  Never done   PNA vac Low Risk Adult (1 of 2 - PCV13) Never done   INFLUENZA VACCINE  04/24/2021    Activities of Daily Living In your present state of health, do you have any difficulty performing the following activities: 05/22/2021  Hearing? N  Vision? N  Difficulty concentrating or making decisions? N  Walking or climbing stairs? N  Dressing or bathing? N  Doing errands, shopping? N  Preparing Food and eating ? N  Using the Toilet? N  In the past six months, have you accidently leaked urine? N  Do you have problems with loss of bowel control? N  Managing your Medications? N  Managing your Finances? N  Housekeeping or managing your Housekeeping? N     Physical Exam  Physical Exam Constitutional:      General: She is not in acute distress.    Appearance: She is well-developed. She is not diaphoretic.  HENT:     Right Ear: External ear normal.     Left Ear: External ear normal.  Eyes:     Conjunctiva/sclera: Conjunctivae normal.  Cardiovascular:     Rate and Rhythm: Normal rate and regular rhythm.     Heart sounds: No murmur heard. Pulmonary:     Effort: Pulmonary effort is normal. No respiratory distress.     Breath sounds: Normal breath sounds. No wheezing.  Musculoskeletal:     Cervical back: Neck supple.  Skin:    General: Skin is warm and dry.     Capillary Refill: Capillary refill takes less than 2 seconds.  Neurological:     Mental Status: She is alert. Mental status is at baseline.  Psychiatric:        Mood and Affect: Mood normal.        Behavior: Behavior normal.    EKG: NSR, no st changes or t-wave abnormalities  Advanced Directives: Does Patient Have a Medical Advance Directive?: No Would patient like information on creating a medical advance directive?: Yes (MAU/Ambulatory/Procedural Areas - Information given)    Assessment:    This is a routine wellness examination for this patient .   Vision/Hearing screen Hearing Screening   250Hz  500Hz  1000Hz  2000Hz  4000Hz   Right ear 20 20 20 20 20   Left ear 20 20 20 20 20    Vision Screening   Right eye Left eye Both eyes  Without correction     With correction 20/20 20/20 20/20     Dietary issues and exercise activities discussed:  Current Exercise Habits: The patient does not participate in regular exercise at present, Exercise limited by: None identified   Goals      Exercise 3x per week (30 min per time)     Would like to walk       Depression Screen PHQ 2/9 Scores 05/22/2021 09/15/2020  PHQ - 2 Score 0 0     Fall Risk Fall Risk  05/22/2021  Falls in the past year? 0  Number falls in past yr: 0    Cognitive Function:     Mini-Cog -  05/22/21 0900     Normal clock drawing test? yes    How many words correct? 3                Patient Care Team: , MD as PCP - General (Family Medicine) , MD (Dermatology)     Plan:    I have personally reviewed and noted the following in the patient's chart:   Medical and social history Use of alcohol, tobacco or illicit drugs  Current medications and supplements Functional ability and status Nutritional status  Physical activity Advanced directives List of other physicians Hospitalizations, surgeries, and ER visits in previous 12 months Vitals Screenings to include cognitive, depression, and falls Referrals and appointments  Will obtain outside records to update health maintenance   In addition, I have reviewed and discussed with patient certain preventive protocols, quality metrics, and best practice recommendations. A written personalized care plan for preventive services as well as general preventive health recommendations were provided to patient.     Lynnda Child, MD 05/22/2021

## 2021-05-22 NOTE — Patient Instructions (Addendum)
Vaccines - Tdap and Shingrix at the pharmacy   Please call the location of your choice from the menu below to schedule your Mammogram and/or Bone Density appointment.     [facility]   Phone:  [phone]   [address]                                                                            [place], [place] [address]                                            Services: 3D Mammogram and Bone Density   Would recommend the following for bone health:   1) 800 units of Vitamin D daily 2) Get 1200 mg of elemental calcium --- this is best from your diet. Try to track how much calcium you get on a typical day. You could find ways to add more (dairy products, leafy greens). Take a supplement for whatever you don't typically get so you reach 1200 mg of calcium.  3) Physical activity (ideally weight bearing) - like walking briskly 30 minutes 5 days a week.    Labs today to check cholesterol and liver and kidney function

## 2021-05-30 ENCOUNTER — Other Ambulatory Visit: Payer: Self-pay | Admitting: Dermatology

## 2021-05-30 DIAGNOSIS — L509 Urticaria, unspecified: Secondary | ICD-10-CM

## 2021-06-27 ENCOUNTER — Other Ambulatory Visit: Payer: Self-pay | Admitting: Dermatology

## 2021-06-27 ENCOUNTER — Telehealth: Payer: Self-pay

## 2021-06-27 DIAGNOSIS — L509 Urticaria, unspecified: Secondary | ICD-10-CM

## 2021-06-27 NOTE — Telephone Encounter (Signed)
Patient states that she doesn't need a refill on Singulair. She states she has been doing OK without it and will call us if she would like it refilled.

## 2021-08-10 ENCOUNTER — Encounter: Payer: Self-pay | Admitting: Family Medicine

## 2021-09-18 ENCOUNTER — Other Ambulatory Visit: Payer: Self-pay | Admitting: Family Medicine

## 2022-02-02 ENCOUNTER — Other Ambulatory Visit: Payer: Self-pay | Admitting: Family Medicine

## 2022-02-02 DIAGNOSIS — Z1231 Encounter for screening mammogram for malignant neoplasm of breast: Secondary | ICD-10-CM

## 2022-03-06 ENCOUNTER — Ambulatory Visit
Admission: RE | Admit: 2022-03-06 | Discharge: 2022-03-06 | Disposition: A | Discharge status: Home / Self Care | Payer: Medicare HMO | Source: Ambulatory Visit | Attending: Family Medicine | Admitting: Family Medicine

## 2022-03-06 DIAGNOSIS — Z1231 Encounter for screening mammogram for malignant neoplasm of breast: Secondary | ICD-10-CM | POA: Insufficient documentation

## 2022-03-13 ENCOUNTER — Inpatient Hospital Stay
Admission: RE | Admit: 2022-03-13 | Discharge: 2022-03-13 | Disposition: A | Discharge status: Home / Self Care | Payer: Self-pay | Source: Ambulatory Visit | Attending: *Deleted | Admitting: *Deleted

## 2022-03-13 ENCOUNTER — Other Ambulatory Visit: Payer: Self-pay | Admitting: Family Medicine

## 2022-03-13 ENCOUNTER — Other Ambulatory Visit: Payer: Self-pay | Admitting: *Deleted

## 2022-03-13 DIAGNOSIS — Z1231 Encounter for screening mammogram for malignant neoplasm of breast: Secondary | ICD-10-CM

## 2022-03-13 DIAGNOSIS — R928 Other abnormal and inconclusive findings on diagnostic imaging of breast: Secondary | ICD-10-CM

## 2022-03-14 ENCOUNTER — Other Ambulatory Visit: Payer: Self-pay | Admitting: Family Medicine

## 2022-03-14 DIAGNOSIS — N6489 Other specified disorders of breast: Secondary | ICD-10-CM

## 2022-03-14 DIAGNOSIS — R928 Other abnormal and inconclusive findings on diagnostic imaging of breast: Secondary | ICD-10-CM

## 2022-03-30 ENCOUNTER — Ambulatory Visit
Admission: RE | Admit: 2022-03-30 | Discharge: 2022-03-30 | Disposition: A | Discharge status: Home / Self Care | Payer: Medicare HMO | Source: Ambulatory Visit | Attending: Family Medicine | Admitting: Family Medicine

## 2022-03-30 DIAGNOSIS — N6489 Other specified disorders of breast: Secondary | ICD-10-CM

## 2022-03-30 DIAGNOSIS — R928 Other abnormal and inconclusive findings on diagnostic imaging of breast: Secondary | ICD-10-CM | POA: Diagnosis not present

## 2022-05-10 ENCOUNTER — Telehealth: Payer: Self-pay | Admitting: Family Medicine

## 2022-05-10 NOTE — Telephone Encounter (Signed)
Left message for patient to call back and schedule Medicare Annual Wellness Visit (AWV).   Please offer to do virtually or by telephone.   Last AWV:05/22/2021  Please schedule at anytime with [facility]-Nurse Health Advisor schedule   45 minute appointent  If any questions, please contact me at [phone]  

## 2022-05-24 ENCOUNTER — Ambulatory Visit (INDEPENDENT_AMBULATORY_CARE_PROVIDER_SITE_OTHER): Payer: Medicare HMO

## 2022-05-24 ENCOUNTER — Ambulatory Visit (INDEPENDENT_AMBULATORY_CARE_PROVIDER_SITE_OTHER)
Admission: RE | Admit: 2022-05-24 | Discharge: 2022-05-24 | Disposition: A | Discharge status: Home / Self Care | Payer: Medicare HMO | Source: Ambulatory Visit | Attending: Family Medicine | Admitting: Family Medicine

## 2022-05-24 ENCOUNTER — Ambulatory Visit (INDEPENDENT_AMBULATORY_CARE_PROVIDER_SITE_OTHER): Payer: Medicare HMO | Admitting: Family Medicine

## 2022-05-24 VITALS — Ht 64.0 in | Wt 216.0 lb

## 2022-05-24 VITALS — BP 140/92 | HR 116 | Temp 97.5°F | Ht 64.0 in | Wt 216.1 lb

## 2022-05-24 DIAGNOSIS — Z0000 Encounter for general adult medical examination without abnormal findings: Secondary | ICD-10-CM

## 2022-05-24 DIAGNOSIS — Z1211 Encounter for screening for malignant neoplasm of colon: Secondary | ICD-10-CM

## 2022-05-24 DIAGNOSIS — I10 Essential (primary) hypertension: Secondary | ICD-10-CM

## 2022-05-24 DIAGNOSIS — E2839 Other primary ovarian failure: Secondary | ICD-10-CM | POA: Diagnosis not present

## 2022-05-24 DIAGNOSIS — U099 Post covid-19 condition, unspecified: Secondary | ICD-10-CM

## 2022-05-24 DIAGNOSIS — Z23 Encounter for immunization: Secondary | ICD-10-CM

## 2022-05-24 DIAGNOSIS — E782 Mixed hyperlipidemia: Secondary | ICD-10-CM | POA: Diagnosis not present

## 2022-05-24 DIAGNOSIS — R053 Chronic cough: Secondary | ICD-10-CM

## 2022-05-24 DIAGNOSIS — R059 Cough, unspecified: Secondary | ICD-10-CM | POA: Diagnosis not present

## 2022-05-24 LAB — COMPREHENSIVE METABOLIC PANEL
ALT: 25 U/L (ref 0–35)
AST: 25 U/L (ref 0–37)
Albumin: 4.1 g/dL (ref 3.5–5.2)
Alkaline Phosphatase: 84 U/L (ref 39–117)
BUN: 10 mg/dL (ref 6–23)
CO2: 27 mEq/L (ref 19–32)
Calcium: 9.4 mg/dL (ref 8.4–10.5)
Chloride: 100 mEq/L (ref 96–112)
Creatinine, Ser: 0.67 mg/dL (ref 0.40–1.20)
GFR: 90.91 mL/min (ref >60.00)
Glucose, Bld: 95 mg/dL (ref 70–99)
Potassium: 4.2 mEq/L (ref 3.5–5.1)
Sodium: 135 mEq/L (ref 135–145)
Total Bilirubin: 0.9 mg/dL (ref 0.2–1.2)
Total Protein: 7.7 g/dL (ref 6.0–8.3)

## 2022-05-24 LAB — LIPID PANEL
Cholesterol: 194 mg/dL (ref 0–200)
HDL: 45 mg/dL (ref >39.00)
NonHDL: 148.64
Total CHOL/HDL Ratio: 4
Triglycerides: 216 mg/dL — ABNORMAL HIGH (ref 0.0–149.0)
VLDL: 43.2 mg/dL — ABNORMAL HIGH (ref 0.0–40.0)

## 2022-05-24 LAB — LDL CHOLESTEROL, DIRECT: Direct LDL: 118 mg/dL

## 2022-05-24 NOTE — Patient Instructions (Addendum)
Cough - x-ray - might be allergies  Blood pressure  - continue medication   Schedule dexa next year with your mammogram

## 2022-05-24 NOTE — Progress Notes (Signed)
Virtual Visit via Telephone Note  I connected with  Evelyn Bell on 05/24/22 at  2:15 PM EDT by telephone and verified that I am speaking with the correct person using two identifiers.  Location: Patient: home Provider: [facility] Persons participating in the virtual visit: patient/Nurse Health Advisor   I discussed the limitations, risks, security and privacy concerns of performing an evaluation and management service by telephone and the availability of in person appointments. The patient expressed understanding and agreed to proceed.  Interactive audio and video telecommunications were attempted between this nurse and patient, however failed, due to patient having technical difficulties OR patient did not have access to video capability.  We continued and completed visit with audio only.  Some vital signs may be absent or patient reported.   Hal Hope, LPN  Subjective:   Evelyn Bell is a 66 y.o. female who presents for Medicare Annual (Subsequent) preventive examination.  Review of Systems     Cardiac Risk Factors include: advanced age (>48men, >27 women);hypertension;dyslipidemia     Objective:    There were no vitals filed for this visit. There is no height or weight on file to calculate BMI.     05/24/2022    2:14 PM 05/22/2021    8:56 AM  Advanced Directives  Does Patient Have a Medical Advance Directive? No No  Would patient like information on creating a medical advance directive? No - Patient declined Yes (MAU/Ambulatory/Procedural Areas - Information given)    Current Medications (verified) Outpatient Encounter Medications as of 05/24/2022  Medication Sig   atorvastatin (LIPITOR) 20 MG tablet TAKE 1 TABLET BY MOUTH EVERY DAY   losartan (COZAAR) 50 MG tablet TAKE 1 TABLET BY MOUTH EVERY DAY   [DISCONTINUED] Cholecalciferol (VITAMIN D-3) 25 MCG (1000 UT) CAPS Take 1,000 Units by mouth daily.   [DISCONTINUED] cyanocobalamin 1000 MCG tablet  Take 1,000 mcg by mouth daily.   [DISCONTINUED] fexofenadine (ALLEGRA) 180 MG tablet Take 180 mg by mouth 2 (two) times daily.   [DISCONTINUED] montelukast (SINGULAIR) 10 MG tablet TAKE 1 TABLET BY MOUTH EVERYDAY AT BEDTIME   No facility-administered encounter medications on file as of 05/24/2022.    Allergies (verified) Patient has no known allergies.   History: Past Medical History:  Diagnosis Date   Blood transfusion without reported diagnosis    Hyperlipidemia    Hypertension    Past Surgical History:  Procedure Laterality Date   ABDOMINAL HYSTERECTOMY     BREAST CYST ASPIRATION Right 2018   CESAREAN SECTION     LAPAROSCOPIC GASTRIC SLEEVE RESECTION  2015   Family History  Problem Relation Age of Onset   Heart disease Mother    Valvular heart disease Mother        passed away surgical complications   Heart disease Sister    Social History   Socioeconomic History   Marital status: Married    Spouse name: Fayrene Fearing   Number of children: 2   Years of education: high school   Highest education level: Not on file  Occupational History   Not on file  Tobacco Use   Smoking status: Never   Smokeless tobacco: Never  Vaping Use   Vaping Use: Never used  Substance and Sexual Activity   Alcohol use: Yes    Comment: 1-2 glasses daily   Drug use: Never   Sexual activity: Yes    Birth control/protection: Surgical  Other Topics Concern   Not on file  Social History Narrative  09/15/20   From: [place], moved 2021 to be closer to children   Living: with husband, Fayrene Fearing [phone])   Work: retired from working as Scientist, physiological for [employer]      Family: 2 children - Renae Fickle and Amy - 3 grandchildren      Enjoys: garden, reading      Exercise: not currently, enjoys riding bikes   Diet: pretty healthy      Safety   Seat belts: Yes    Guns: No   Safe in relationships: Yes    Social Determinants of Health   Financial Resource Strain: Low Risk  (05/24/2022)   Overall Financial  Resource Strain (CARDIA)    Difficulty of Paying Living Expenses: Not hard at all  Food Insecurity: No Food Insecurity (05/24/2022)   Hunger Vital Sign    Worried About Running Out of Food in the Last Year: Never true    Ran Out of Food in the Last Year: Never true  Transportation Needs: No Transportation Needs (05/24/2022)   PRAPARE - Administrator, Civil Service (Medical): No    Lack of Transportation (Non-Medical): No  Physical Activity: Insufficiently Active (05/24/2022)   Exercise Vital Sign    Days of Exercise per Week: 2 days    Minutes of Exercise per Session: 20 min  Stress: No Stress Concern Present (05/24/2022)   [employer] of Occupational Health - Occupational Stress Questionnaire    Feeling of Stress : Not at all  Social Connections: Socially Isolated (05/24/2022)   Social Connection and Isolation Panel [NHANES]    Frequency of Communication with Friends and Family: Once a week    Frequency of Social Gatherings with Friends and Family: Once a week    Attends Religious Services: Never    Database administrator or Organizations: No    Attends Engineer, structural: Never    Marital Status: Married    Tobacco Counseling Counseling given: Not Answered   Clinical Intake:  Pre-visit preparation completed: Yes  Pain : No/denies pain     Diabetes: No  How often do you need to have someone help you when you read instructions, pamphlets, or other written materials from your doctor or pharmacy?: 1 - Never  Diabetic?no  Interpreter Needed?: No  Information entered by :: Kennedy Bucker, LPN   Activities of Daily Living    05/24/2022    2:15 PM 05/21/2022   11:47 AM  In your present state of health, do you have any difficulty performing the following activities:  Hearing? 0 0  Vision? 0 0  Difficulty concentrating or making decisions? 0 0  Walking or climbing stairs? 0 0  Dressing or bathing? 0 0  Doing errands, shopping? 0 0  Preparing  Food and eating ? N N  Using the Toilet? N N  In the past six months, have you accidently leaked urine? N N  Do you have problems with loss of bowel control? N N  Managing your Medications? N N  Managing your Finances? N N  Housekeeping or managing your Housekeeping? N N    Patient Care Team: Lynnda Child, MD as PCP - General (Family Medicine) Deirdre Evener, MD (Dermatology)  Indicate any recent Medical Services you may have received from other than [facility] providers in the past year (date may be approximate).     Assessment:   This is a routine wellness examination for Evelyn Bell.  Hearing/Vision screen Hearing Screening - Comments:: No aids Vision Screening -  Comments:: Wears glasses- [facility]  Dietary issues and exercise activities discussed: Current Exercise Habits: Home exercise routine, Type of exercise: walking, Time (Minutes): 20, Frequency (Times/Week): 2, Weekly Exercise (Minutes/Week): 40, Intensity: Mild   Goals Addressed             This Visit's Progress    DIET - EAT MORE FRUITS AND VEGETABLES         Depression Screen    05/24/2022    2:13 PM 05/24/2022   10:30 AM 05/22/2021   10:41 AM 05/22/2021    8:59 AM 09/15/2020    9:59 AM  PHQ 2/9 Scores  PHQ - 2 Score 0 0 0 0 0  PHQ- 9 Score 0        Fall Risk    05/24/2022    2:15 PM 05/24/2022    9:53 AM 05/21/2022   11:47 AM 05/22/2021    8:42 AM  Fall Risk   Falls in the past year? 0 0 0 0  Number falls in past yr: 0 0 0 0  Injury with Fall? 0  0   Risk for fall due to : No Fall Risks     Follow up Falls prevention discussed;Falls evaluation completed       FALL RISK PREVENTION PERTAINING TO THE HOME:  Any stairs in or around the home? Yes  If so, are there any without handrails? No  Home free of loose throw rugs in walkways, pet beds, electrical cords, etc? Yes  Adequate lighting in your home to reduce risk of falls? Yes   ASSISTIVE DEVICES UTILIZED TO PREVENT FALLS:  Life alert? No   Use of a cane, walker or w/c? No  Grab bars in the bathroom? No  Shower chair or bench in shower? Yes  Elevated toilet seat or a handicapped toilet? No   Cognitive Function:        05/24/2022    2:16 PM  6CIT Screen  What Year? 0 points  What month? 0 points  What time? 0 points  Count back from 20 0 points  Months in reverse 0 points  Repeat phrase 0 points  Total Score 0 points    Immunizations Immunization History  Administered Date(s) Administered   Influenza,inj,Quad PF,6+ Mos 05/27/2020, 05/22/2021, 05/24/2022   PFIZER(Purple Top)SARS-COV-2 Vaccination 12/11/2019, 01/01/2020, 09/12/2020, 02/15/2021   PNEUMOCOCCAL CONJUGATE-20 05/24/2022   Pfizer Covid-19 Vaccine Bivalent Booster 24yrs & up 06/26/2021    TDAP status: Due, Education has been provided regarding the importance of this vaccine. Advised may receive this vaccine at local pharmacy or Health Dept. Aware to provide a copy of the vaccination record if obtained from local pharmacy or Health Dept. Verbalized acceptance and understanding.  Flu Vaccine status: Up to date  Pneumococcal vaccine status: Completed during today's visit.  Covid-19 vaccine status: Completed vaccines  Qualifies for Shingles Vaccine? Yes   Zostavax completed No   Shingrix Completed?: No.    Education has been provided regarding the importance of this vaccine. Patient has been advised to call insurance company to determine out of pocket expense if they have not yet received this vaccine. Advised may also receive vaccine at local pharmacy or Health Dept. Verbalized acceptance and understanding.  Screening Tests Health Maintenance  Topic Date Due   TETANUS/TDAP  Never done   COLONOSCOPY (Pts 45-23yrs Insurance coverage will need to be confirmed)  Never done   Zoster Vaccines- Shingrix (1 of 2) Never done   DEXA SCAN  Never done  COVID-19 Vaccine (6 - Pfizer series) 10/27/2021   MAMMOGRAM  03/31/2023   Pneumonia Vaccine 30+ Years old   Completed   INFLUENZA VACCINE  Completed   Hepatitis C Screening  Completed   HPV VACCINES  Aged Out    Health Maintenance  Health Maintenance Due  Topic Date Due   TETANUS/TDAP  Never done   COLONOSCOPY (Pts 45-65yrs Insurance coverage will need to be confirmed)  Never done   Zoster Vaccines- Shingrix (1 of 2) Never done   DEXA SCAN  Never done   COVID-19 Vaccine (6 - Pfizer series) 10/27/2021    Colorectal cancer screening: Type of screening: FOBT/FIT. Completed today. Repeat every year years  Mammogram status: Completed 03/30/22. Repeat every year  Bone Density status: Ordered today. Pt provided with contact info and advised to call to schedule appt.  Lung Cancer Screening: (Low Dose CT Chest recommended if Age 58-80 years, 30 pack-year currently smoking OR have quit w/in 15years.) does not qualify.   Additional Screening:  Hepatitis C Screening: does qualify; Completed 09/29/20  Vision Screening: Recommended annual ophthalmology exams for early detection of glaucoma and other disorders of the eye. Is the patient up to date with their annual eye exam?  Yes  Who is the provider or what is the name of the office in which the patient attends annual eye exams? [facility] If pt is not established with a provider, would they like to be referred to a provider to establish care? No .   Dental Screening: Recommended annual dental exams for proper oral hygiene  Community Resource Referral / Chronic Care Management: CRR required this visit?  No   CCM required this visit?  No      Plan:     I have personally reviewed and noted the following in the patient's chart:   Medical and social history Use of alcohol, tobacco or illicit drugs  Current medications and supplements including opioid prescriptions. Patient is not currently taking opioid prescriptions. Functional ability and status Nutritional status Physical activity Advanced directives List of other  physicians Hospitalizations, surgeries, and ER visits in previous 12 months Vitals Screenings to include cognitive, depression, and falls Referrals and appointments  In addition, I have reviewed and discussed with patient certain preventive protocols, quality metrics, and best practice recommendations. A written personalized care plan for preventive services as well as general preventive health recommendations were provided to patient.     Hal Hope, LPN   03/24/7792   Nurse Notes: none

## 2022-05-24 NOTE — Assessment & Plan Note (Addendum)
Persistent cough, intermittent since covid. CXR with some airway thickening. Will f/u final read. Pt not interested in pulmonology referral and no sinus symptoms. Pending final read consider trial of ppi.

## 2022-05-24 NOTE — Progress Notes (Signed)
Annual Exam   Chief Complaint:  Chief Complaint  Patient presents with   Medicare Wellness    Part 1 is scheduled this afternoon    History of Present Illness:  Ms. Evelyn Bell is a 66 y.o. No obstetric history on file. who LMP was No LMP recorded. Patient has had a hysterectomy., presents today for her annual examination.     Nutrition She does get adequate calcium and Vitamin D in her diet. Diet: healthy diet Exercise: swimming in the pool - daily    Social History   Tobacco Use  Smoking Status Never  Smokeless Tobacco Never   Social History   Substance and Sexual Activity  Alcohol Use Yes   Comment: 1-2 glasses daily   Social History   Substance and Sexual Activity  Drug Use Never     General Health Dentist in the last year: Yes Eye doctor: yes  Safety The patient wears seatbelts: yes.     The patient feels safe at home and in their relationships: yes.   Menstrual:  Symptoms of menopause: done  GYN She is not sexually active.    Cervical Cancer Screening (21-65):   Aged out  Breast Cancer Screening (Age 35-74):  There is no FH of breast cancer. There is no FH of ovarian cancer. BRCA screening Not Indicated.  Last Mammogram: 03/2022 The patient does want a mammogram this year.    Colon Cancer Screening:  Age 49-75 yo - benefits outweigh the risk. Adults 32-85 yo who have never been screened benefit.  Benefits: 134000 people in 2016 will be diagnosed and 49,000 will die - early detection helps Harms: Complications 2/2 to colonoscopy High Risk (Colonoscopy): genetic disorder (Lynch syndrome or familial adenomatous polyposis), personal hx of IBD, previous adenomatous polyp, or previous colorectal cancer, FamHx start 10 years before the age at diagnosis, increased in males and black race  Options:  FIT - looks for hemoglobin (blood in the stool) - specific and fairly sensitive - must be done annually Cologuard - looks for DNA and blood - more  sensitive - therefore can have more false positives, every 3 years Colonoscopy - every 10 years if normal - sedation, bowl prep, must have someone drive you  Shared decision making and the patient had decided to do FOBT.   Social History   Tobacco Use  Smoking Status Never  Smokeless Tobacco Never    Lung Cancer Screening (Ages 92-33): not applicable   Weight Wt Readings from Last 3 Encounters:  05/24/22 216 lb 2 oz (98 kg)  05/22/21 212 lb 4 oz (96.3 kg)  01/25/21 210 lb 8 oz (95.5 kg)   Patient has high BMI  BMI Readings from Last 1 Encounters:  05/24/22 37.10 kg/m     Chronic disease screening Blood pressure monitoring:  BP Readings from Last 3 Encounters:  05/24/22 (!) 140/92  05/22/21 138/82  01/25/21 (!) 148/100    Lipid Monitoring: Indication for screening: age >49, obesity, diabetes, family hx, CV risk factors.  Lipid screening: Yes  Lab Results  Component Value Date   CHOL 153 05/22/2021   HDL 39.70 05/22/2021   LDLCALC 82 05/22/2021   TRIG 153.0 (H) 05/22/2021   CHOLHDL 4 05/22/2021     Diabetes Screening: age >34, overweight, family hx, PCOS, hx of gestational diabetes, at risk ethnicity Diabetes Screening screening: Yes  No results found for: "HGBA1C"   Past Medical History:  Diagnosis Date   Blood transfusion without reported diagnosis    Hyperlipidemia  Hypertension     Past Surgical History:  Procedure Laterality Date   ABDOMINAL HYSTERECTOMY     BREAST CYST ASPIRATION Right 2018   CESAREAN SECTION     LAPAROSCOPIC GASTRIC SLEEVE RESECTION  2015    Prior to Admission medications   Medication Sig Start Date End Date Taking? Authorizing Provider  atorvastatin (LIPITOR) 20 MG tablet TAKE 1 TABLET BY MOUTH EVERY DAY 09/19/21  Yes Lesleigh Noe, MD  losartan (COZAAR) 50 MG tablet TAKE 1 TABLET BY MOUTH EVERY DAY 09/19/21  Yes Lesleigh Noe, MD    No Known Allergies  Gynecologic History: No LMP recorded. Patient has had a  hysterectomy.  Obstetric History: No obstetric history on file.  Social History   Socioeconomic History   Marital status: Married    Spouse name: Jeneen Rinks   Number of children: 2   Years of education: high school   Highest education level: Not on file  Occupational History   Not on file  Tobacco Use   Smoking status: Never   Smokeless tobacco: Never  Vaping Use   Vaping Use: Never used  Substance and Sexual Activity   Alcohol use: Yes    Comment: 1-2 glasses daily   Drug use: Never   Sexual activity: Yes    Birth control/protection: Surgical  Other Topics Concern   Not on file  Social History Narrative   09/15/20   From: [place], moved 2021 to be closer to children   Living: with husband, Jeneen Rinks (1981)   Work: retired from working as Research scientist (physical sciences) for [employer]      Family: 2 children - Eddie Dibbles and Amy - 3 grandchildren      Enjoys: garden, reading      Exercise: not currently, enjoys riding bikes   Diet: pretty healthy      Safety   Seat belts: Yes    Guns: No   Safe in relationships: Yes    Social Determinants of Radio broadcast assistant Strain: Not on file  Food Insecurity: Not on file  Transportation Needs: Not on file  Physical Activity: Not on file  Stress: Not on file  Social Connections: Not on file  Intimate Partner Violence: Not on file    Family History  Problem Relation Age of Onset   Heart disease Mother    Valvular heart disease Mother        passed away surgical complications   Heart disease Sister     Review of Systems  Constitutional:  Negative for chills and fever.  HENT:  Negative for congestion and sore throat.   Eyes:  Negative for blurred vision and double vision.  Respiratory:  Negative for shortness of breath.   Cardiovascular:  Negative for chest pain.  Gastrointestinal:  Negative for heartburn, nausea and vomiting.  Genitourinary: Negative.   Musculoskeletal: Negative.  Negative for myalgias.  Skin:  Negative for rash.   Neurological:  Negative for dizziness and headaches.  Endo/Heme/Allergies:  Does not bruise/bleed easily.  Psychiatric/Behavioral:  Negative for depression. The patient is not nervous/anxious.      Physical Exam BP (!) 140/92   Pulse (!) 116   Temp (!) 97.5 F (36.4 C) (Temporal)   Ht $R'5\' 4"'Cn$  (1.626 m)   Wt 216 lb 2 oz (98 kg)   SpO2 98%   BMI 37.10 kg/m    BP Readings from Last 3 Encounters:  05/24/22 (!) 140/92  05/22/21 138/82  01/25/21 (!) 148/100      Physical  Exam Constitutional:      General: She is not in acute distress.    Appearance: She is well-developed. She is not diaphoretic.  HENT:     Head: Normocephalic and atraumatic.     Right Ear: External ear normal.     Left Ear: External ear normal.     Nose: Nose normal.  Eyes:     General: No scleral icterus.    Extraocular Movements: Extraocular movements intact.     Conjunctiva/sclera: Conjunctivae normal.  Cardiovascular:     Rate and Rhythm: Normal rate and regular rhythm.     Heart sounds: No murmur heard. Pulmonary:     Effort: Pulmonary effort is normal. No respiratory distress.     Breath sounds: Normal breath sounds. No wheezing.  Abdominal:     General: Bowel sounds are normal. There is no distension.     Palpations: Abdomen is soft. There is no mass.     Tenderness: There is no abdominal tenderness. There is no guarding or rebound.  Musculoskeletal:        General: Normal range of motion.     Cervical back: Neck supple.  Lymphadenopathy:     Cervical: No cervical adenopathy.  Skin:    General: Skin is warm and dry.     Capillary Refill: Capillary refill takes less than 2 seconds.  Neurological:     Mental Status: She is alert and oriented to person, place, and time.     Deep Tendon Reflexes: Reflexes normal.  Psychiatric:        Mood and Affect: Mood normal.        Behavior: Behavior normal.     Results:  PHQ-9:     05/24/2022   10:30 AM 05/22/2021   10:41 AM 05/22/2021    8:59 AM   Depression screen PHQ 2/9  Decreased Interest 0 0 0  Down, Depressed, Hopeless 0 0 0  PHQ - 2 Score 0 0 0    CXR (my read): Bronchial thickening, appears to have some atherosclerosis, no consolidation will f/u final read   Assessment: 66 y.o. No obstetric history on file. female here for routine annual physical examination.  Plan: Problem List Items Addressed This Visit       Cardiovascular and Mediastinum   Hypertension   Relevant Orders   Comprehensive metabolic panel     Other   Hyperlipidemia   Relevant Orders   Lipid panel   Post-COVID chronic cough    Persistent cough, intermittent since covid. CXR with some airway thickening. Will f/u final read. Pt not interested in pulmonology referral and no sinus symptoms. Pending final read consider trial of ppi.       Relevant Orders   DG Chest 2 View   Other Visit Diagnoses     Annual physical exam    -  Primary   Relevant Orders   Pneumococcal conjugate vaccine 20-valent (Completed)   Screening for colon cancer       Relevant Orders   Fecal occult blood, imunochemical   Estrogen deficiency       Relevant Orders   DG Bone Density   Need for influenza vaccination       Relevant Orders   Flu Vaccine QUAD 54mo+IM (Fluarix, Fluzone & Alfiuria Quad PF) (Completed)       Screening: -- Blood pressure screen  controlled at home -- cholesterol screening: will obtain -- Weight screening: overweight: continue to monitor -- Diabetes Screening: will obtain -- Nutrition: Encouraged healthy diet  The 10-year ASCVD risk score (Arnett DK, et al., 2019) is: 9.8%   Values used to calculate the score:     Age: 66 years     Sex: Female     Is Non-Hispanic African American: No     Diabetic: No     Tobacco smoker: No     Systolic Blood Pressure: 562 mmHg     Is BP treated: Yes     HDL Cholesterol: 39.7 mg/dL     Total Cholesterol: 153 mg/dL  -- Statin therapy for Age 72-75 with CVD risk >7.5%  Psych -- Depression  screening (PHQ-9):    Safety -- tobacco screening: not using -- alcohol screening:  low-risk usage. -- no evidence of domestic violence or intimate partner violence.   Cancer Screening -- pap smear not collected per ASCCP guidelines -- family history of breast cancer screening: done. not at high risk. -- Mammogram -  up to date -- Colon cancer (age 79+)--  up to date  Immunizations Immunization History  Administered Date(s) Administered   Influenza,inj,Quad PF,6+ Mos 05/27/2020, 05/22/2021, 05/24/2022   PFIZER(Purple Top)SARS-COV-2 Vaccination 12/11/2019, 01/01/2020, 09/12/2020, 02/15/2021   PNEUMOCOCCAL CONJUGATE-20 05/24/2022   Pfizer Covid-19 Vaccine Bivalent Booster 82yrs & up 06/26/2021    -- flu vaccine up to date -- TDAP q10 years not up to date - encouraged getting a pharmacy -- Shingles (age >15) not up to date - encouraged -- PPSV-23 (19-64 with chronic disease or smoking) not up to date - due for 65 -- PCV-13 (age >42) - one dose followed by PPSV-23 1 year later up to date -- Covid-19 Vaccine up to date   Encouraged healthy diet and exercise. Encouraged regular vision and dental care.    Lesleigh Noe, MD

## 2022-05-24 NOTE — Patient Instructions (Signed)
Evelyn Bell , Thank you for taking time to come for your Medicare Wellness Visit. I appreciate your ongoing commitment to your health goals. Please review the following plan we discussed and let me know if I can assist you in the future.   Screening recommendations/referrals: Colonoscopy: kit ordered at [facility] office today Mammogram: 03/30/22 Bone Density: ordered today Recommended yearly ophthalmology/optometry visit for glaucoma screening and checkup Recommended yearly dental visit for hygiene and checkup  Vaccinations: Influenza vaccine: 05/24/22 Pneumococcal vaccine: 05/24/22 Tdap vaccine: n/d Shingles vaccine: n/d   Covid-19:12/11/19, 01/01/20, 09/12/20, 02/15/21, 06/26/21  Advanced directives: no  Conditions/risks identified: none  Next appointment: Follow up in one year for your annual wellness visit 05/28/23 @ 2:45 pm by phone   Preventive Care 65 Years and Older, Female Preventive care refers to lifestyle choices and visits with your health care provider that can promote health and wellness. What does preventive care include? A yearly physical exam. This is also called an annual well check. Dental exams once or twice a year. Routine eye exams. Ask your health care provider how often you should have your eyes checked. Personal lifestyle choices, including: Daily care of your teeth and gums. Regular physical activity. Eating a healthy diet. Avoiding tobacco and drug use. Limiting alcohol use. Practicing safe sex. Taking low-dose aspirin every day. Taking vitamin and mineral supplements as recommended by your health care provider. What happens during an annual well check? The services and screenings done by your health care provider during your annual well check will depend on your age, overall health, lifestyle risk factors, and family history of disease. Counseling  Your health care provider may ask you questions about your: Alcohol use. Tobacco use. Drug  use. Emotional well-being. Home and relationship well-being. Sexual activity. Eating habits. History of falls. Memory and ability to understand (cognition). Work and work Statistician. Reproductive health. Screening  You may have the following tests or measurements: Height, weight, and BMI. Blood pressure. Lipid and cholesterol levels. These may be checked every 5 years, or more frequently if you are over 77 years old. Skin check. Lung cancer screening. You may have this screening every year starting at age 74 if you have a 30-pack-year history of smoking and currently smoke or have quit within the past 15 years. Fecal occult blood test (FOBT) of the stool. You may have this test every year starting at age 41. Flexible sigmoidoscopy or colonoscopy. You may have a sigmoidoscopy every 5 years or a colonoscopy every 10 years starting at age 37. Hepatitis C blood test. Hepatitis B blood test. Sexually transmitted disease (STD) testing. Diabetes screening. This is done by checking your blood sugar (glucose) after you have not eaten for a while (fasting). You may have this done every 1-3 years. Bone density scan. This is done to screen for osteoporosis. You may have this done starting at age 11. Mammogram. This may be done every 1-2 years. Talk to your health care provider about how often you should have regular mammograms. Talk with your health care provider about your test results, treatment options, and if necessary, the need for more tests. Vaccines  Your health care provider may recommend certain vaccines, such as: Influenza vaccine. This is recommended every year. Tetanus, diphtheria, and acellular pertussis (Tdap, Td) vaccine. You may need a Td booster every 10 years. Zoster vaccine. You may need this after age 30. Pneumococcal 13-valent conjugate (PCV13) vaccine. One dose is recommended after age 31. Pneumococcal polysaccharide (PPSV23) vaccine. One dose is recommended  after age  86. Talk to your health care provider about which screenings and vaccines you need and how often you need them. This information is not intended to replace advice given to you by your health care provider. Make sure you discuss any questions you have with your health care provider. Document Released: 10/07/2015 Document Revised: 05/30/2016 Document Reviewed: 07/12/2015 Elsevier Interactive Patient Education  2017 [place] Prevention in the Home Falls can cause injuries. They can happen to people of all ages. There are many things you can do to make your home safe and to help prevent falls. What can I do on the outside of my home? Regularly fix the edges of walkways and driveways and fix any cracks. Remove anything that might make you trip as you walk through a door, such as a raised step or threshold. Trim any bushes or trees on the path to your home. Use bright outdoor lighting. Clear any walking paths of anything that might make someone trip, such as rocks or tools. Regularly check to see if handrails are loose or broken. Make sure that both sides of any steps have handrails. Any raised decks and porches should have guardrails on the edges. Have any leaves, snow, or ice cleared regularly. Use sand or salt on walking paths during winter. Clean up any spills in your garage right away. This includes oil or grease spills. What can I do in the bathroom? Use night lights. Install grab bars by the toilet and in the tub and shower. Do not use towel bars as grab bars. Use non-skid mats or decals in the tub or shower. If you need to sit down in the shower, use a plastic, non-slip stool. Keep the floor dry. Clean up any water that spills on the floor as soon as it happens. Remove soap buildup in the tub or shower regularly. Attach bath mats securely with double-sided non-slip rug tape. Do not have throw rugs and other things on the floor that can make you trip. What can I do in the  bedroom? Use night lights. Make sure that you have a light by your bed that is easy to reach. Do not use any sheets or blankets that are too big for your bed. They should not hang down onto the floor. Have a firm chair that has side arms. You can use this for support while you get dressed. Do not have throw rugs and other things on the floor that can make you trip. What can I do in the kitchen? Clean up any spills right away. Avoid walking on wet floors. Keep items that you use a lot in easy-to-reach places. If you need to reach something above you, use a strong step stool that has a grab bar. Keep electrical cords out of the way. Do not use floor polish or wax that makes floors slippery. If you must use wax, use non-skid floor wax. Do not have throw rugs and other things on the floor that can make you trip. What can I do with my stairs? Do not leave any items on the stairs. Make sure that there are handrails on both sides of the stairs and use them. Fix handrails that are broken or loose. Make sure that handrails are as long as the stairways. Check any carpeting to make sure that it is firmly attached to the stairs. Fix any carpet that is loose or worn. Avoid having throw rugs at the top or bottom of the stairs. If you  do have throw rugs, attach them to the floor with carpet tape. Make sure that you have a light switch at the top of the stairs and the bottom of the stairs. If you do not have them, ask someone to add them for you. What else can I do to help prevent falls? Wear shoes that: Do not have high heels. Have rubber bottoms. Are comfortable and fit you well. Are closed at the toe. Do not wear sandals. If you use a stepladder: Make sure that it is fully opened. Do not climb a closed stepladder. Make sure that both sides of the stepladder are locked into place. Ask someone to hold it for you, if possible. Clearly mark and make sure that you can see: Any grab bars or  handrails. First and last steps. Where the edge of each step is. Use tools that help you move around (mobility aids) if they are needed. These include: Canes. Walkers. Scooters. Crutches. Turn on the lights when you go into a dark area. Replace any light bulbs as soon as they burn out. Set up your furniture so you have a clear path. Avoid moving your furniture around. If any of your floors are uneven, fix them. If there are any pets around you, be aware of where they are. Review your medicines with your doctor. Some medicines can make you feel dizzy. This can increase your chance of falling. Ask your doctor what other things that you can do to help prevent falls. This information is not intended to replace advice given to you by your health care provider. Make sure you discuss any questions you have with your health care provider. Document Released: 07/07/2009 Document Revised: 02/16/2016 Document Reviewed: 10/15/2014 Elsevier Interactive Patient Education  2017 [employer].

## 2022-06-14 DIAGNOSIS — H524 Presbyopia: Secondary | ICD-10-CM | POA: Diagnosis not present

## 2022-06-14 DIAGNOSIS — Z0100 Encounter for examination of eyes and vision without abnormal findings: Secondary | ICD-10-CM | POA: Diagnosis not present

## 2022-09-18 ENCOUNTER — Other Ambulatory Visit: Payer: Self-pay

## 2022-09-18 MED ORDER — LOSARTAN POTASSIUM 50 MG PO TABS: 50.0000 mg | ORAL_TABLET | Freq: Every day | ORAL | 0 refills | Status: DC

## 2022-09-18 MED ORDER — ATORVASTATIN CALCIUM 20 MG PO TABS: 20.0000 mg | ORAL_TABLET | Freq: Every day | ORAL | 0 refills | Status: DC

## 2022-11-02 ENCOUNTER — Other Ambulatory Visit: Payer: Self-pay | Admitting: Radiology

## 2022-11-02 ENCOUNTER — Telehealth: Payer: Self-pay

## 2022-11-02 DIAGNOSIS — R195 Other fecal abnormalities: Secondary | ICD-10-CM

## 2022-11-02 DIAGNOSIS — Z1211 Encounter for screening for malignant neoplasm of colon: Secondary | ICD-10-CM

## 2022-11-02 LAB — FECAL OCCULT BLOOD, IMMUNOCHEMICAL: Fecal Occult Bld: POSITIVE — AB

## 2022-11-02 NOTE — Telephone Encounter (Signed)
[name] with [facility] called critical + ifob for pt. Order was put in today. Pt last saw Dr Einar Pheasant (was Cody's pt) on 05/24/2022 for annual exam. Evidently pt just brought ifob in today. Pt has TOC with Eugenia Pancoast FNP on 11/22/2022. Lawerance Bach is out of office and I spoke with [facility] CMA in lab and was advised to send to a provider in office. Sending note to Dr Marguerita Beards pool and will teams Williams CMA. + ifob entered into lab notebook.

## 2022-11-02 NOTE — Telephone Encounter (Signed)
Plz notify stool test returned positive for blood - for this reason recommend referral to GI.  Referral placed to [facility]. She may call [facility] to schedule an appointment at [phone].

## 2022-11-05 NOTE — Telephone Encounter (Signed)
Will leave in GI's hands. Noted.

## 2022-11-05 NOTE — Telephone Encounter (Signed)
Patient notified as instructed by telephone and verbalized understanding. Patient stated that she has a huge hemorrhoid and not surprised. Patient stated that if this means a colonoscopy she does not want to have another done. Patient stated that she had a bad experience when she had her last one. Patient stated that she will reach out to [facility] and schedule an appointment to see what they recommend.

## 2022-11-05 NOTE — Telephone Encounter (Signed)
Left message on voicemail for patient to call the office back. 

## 2022-11-05 NOTE — Telephone Encounter (Signed)
Patient returned call regarding labs,would like a call back.

## 2022-11-06 ENCOUNTER — Telehealth: Payer: Self-pay | Admitting: Family Medicine

## 2022-11-06 NOTE — Telephone Encounter (Signed)
Patient called in and stated that she would her GI referral to be sent over to [facility]. Please advise. Thank you!

## 2022-11-13 NOTE — Telephone Encounter (Signed)
Referral moved to [facility]

## 2022-11-22 ENCOUNTER — Ambulatory Visit (INDEPENDENT_AMBULATORY_CARE_PROVIDER_SITE_OTHER): Payer: Medicare HMO | Admitting: Family

## 2022-11-22 ENCOUNTER — Encounter: Payer: Self-pay | Admitting: Family

## 2022-11-22 VITALS — BP 142/100 | HR 92 | Temp 97.9°F | Ht 64.0 in | Wt 218.4 lb

## 2022-11-22 DIAGNOSIS — I10 Essential (primary) hypertension: Secondary | ICD-10-CM | POA: Diagnosis not present

## 2022-11-22 DIAGNOSIS — Z79899 Other long term (current) drug therapy: Secondary | ICD-10-CM | POA: Diagnosis not present

## 2022-11-22 DIAGNOSIS — E559 Vitamin D deficiency, unspecified: Secondary | ICD-10-CM

## 2022-11-22 DIAGNOSIS — K644 Residual hemorrhoidal skin tags: Secondary | ICD-10-CM | POA: Diagnosis not present

## 2022-11-22 DIAGNOSIS — E782 Mixed hyperlipidemia: Secondary | ICD-10-CM | POA: Diagnosis not present

## 2022-11-22 DIAGNOSIS — R195 Other fecal abnormalities: Secondary | ICD-10-CM | POA: Diagnosis not present

## 2022-11-22 LAB — HEPATIC FUNCTION PANEL
ALT: 33 U/L (ref 0–35)
AST: 29 U/L (ref 0–37)
Albumin: 3.8 g/dL (ref 3.5–5.2)
Alkaline Phosphatase: 79 U/L (ref 39–117)
Bilirubin, Direct: 0.1 mg/dL (ref 0.0–0.3)
Total Bilirubin: 0.7 mg/dL (ref 0.2–1.2)
Total Protein: 6.8 g/dL (ref 6.0–8.3)

## 2022-11-22 LAB — CBC
HCT: 42.2 % (ref 36.0–46.0)
Hemoglobin: 14.3 g/dL (ref 12.0–15.0)
MCHC: 34 g/dL (ref 30.0–36.0)
MCV: 98 fl (ref 78.0–100.0)
Platelets: 211 10*3/uL (ref 150.0–400.0)
RBC: 4.3 Mil/uL (ref 3.87–5.11)
RDW: 13.8 % (ref 11.5–15.5)
WBC: 6.2 10*3/uL (ref 4.0–10.5)

## 2022-11-22 LAB — LIPID PANEL
Cholesterol: 159 mg/dL (ref 0–200)
HDL: 40.7 mg/dL (ref >39.00)
LDL Cholesterol: 89 mg/dL (ref 0–99)
NonHDL: 118.24
Total CHOL/HDL Ratio: 4
Triglycerides: 145 mg/dL (ref 0.0–149.0)
VLDL: 29 mg/dL (ref 0.0–40.0)

## 2022-11-22 LAB — VITAMIN D 25 HYDROXY (VIT D DEFICIENCY, FRACTURES): VITD: 20.82 ng/mL — ABNORMAL LOW (ref 30.00–100.00)

## 2022-11-22 MED ORDER — LOSARTAN POTASSIUM 100 MG PO TABS: 100.0000 mg | ORAL_TABLET | Freq: Every day | ORAL | 3 refills | Status: DC

## 2022-11-22 NOTE — Assessment & Plan Note (Signed)
Increase losartan to 100 mg once daily  Pt advised of the following:  Continue medication as prescribed. Monitor blood pressure periodically and/or when you feel symptomatic. Goal is <130/90 on average. Ensure that you have rested for 30 minutes prior to checking your blood pressure. Record your readings and bring them to your next visit if necessary.work on a low sodium diet.

## 2022-11-22 NOTE — Assessment & Plan Note (Signed)
Currently asymptomatic however increase in frequency of flare ups with discomfort. Will refer to GI as h/o banding on others.

## 2022-11-22 NOTE — Progress Notes (Signed)
Established Patient Office Visit  Subjective:  Patient ID: Evelyn Bell, female    DOB: [birth year]  Age: 67 y.o. MRN: [id number]  CC:  Chief Complaint  Patient presents with   Establish Care    [facility] from Dr Einar Pheasant    HPI Evelyn Bell is here for a transition of care visit.  Prior provider was:Dr. Waunita Schooner     Pt is without acute concerns.   Positive IFOB 2/9  Pt denies blood in toilet or in stool.  Denies abdominal discomfort. Last colonoscopy was age 38 y/o.  Denies fatigue   chronic concerns:  HTN: blood pressure 142/100  Average at home blood pressure 130/84.  On losartan 50 mg once daily.  Denies cp palp and or sob. No pedal edema.   Hyperlipidemia: atorvastatin 20 mg qhs , does not work on diet. Does not exercise however she does walk with her dog.  Lab Results  Component Value Date   CHOL 194 05/24/2022   HDL 45.00 05/24/2022   LDLCALC 82 05/22/2021   LDLDIRECT 118.0 05/24/2022   TRIG 216.0 (H) 05/24/2022   CHOLHDL 4 05/24/2022   H/o hemorrhoidectomy: starting to bother her at this time. She states intermittent and painful at times.   Positive ifob 2/24 last when she was 67 y/o.    Past Medical History:  Diagnosis Date   Blood transfusion without reported diagnosis    Hyperlipidemia    Hypertension     Past Surgical History:  Procedure Laterality Date   ABDOMINAL HYSTERECTOMY  1981   still with half an ovary , not sure which side   BREAST CYST ASPIRATION Right 2018   CESAREAN SECTION     HEMORROIDECTOMY     LAPAROSCOPIC GASTRIC SLEEVE RESECTION  2015    Family History  Problem Relation Age of Onset   Heart disease Mother    Valvular heart disease Mother        passed away surgical complications   Heart disease Sister     Social History   Socioeconomic History   Marital status: Married    Spouse name: Jeneen Rinks   Number of children: 2   Years of education: high school   Highest education level: Not on file  Occupational  History   Not on file  Tobacco Use   Smoking status: Never   Smokeless tobacco: Never  Vaping Use   Vaping Use: Never used  Substance and Sexual Activity   Alcohol use: Yes    Comment: 1-2 glasses daily   Drug use: Never   Sexual activity: Yes    Partners: Male    Birth control/protection: Surgical  Other Topics Concern   Not on file  Social History Narrative   09/15/20   From: [place], moved 2021 to be closer to children   Living: with husband, Jeneen Rinks (1981)   Work: retired from working as Research scientist (physical sciences) for [employer]      Family: 2 children - Eddie Dibbles and Amy - 3 grandchildren      Enjoys: garden, reading      Exercise: not currently, enjoys riding bikes   Diet: pretty healthy      Safety   Seat belts: Yes    Guns: No   Safe in relationships: Yes    Social Determinants of Health   Financial Resource Strain: Low Risk  (05/24/2022)   Overall Financial Resource Strain (CARDIA)    Difficulty of Paying Living Expenses: Not hard at all  Food Insecurity: No Food Insecurity (05/24/2022)  Hunger Vital Sign    Worried About Running Out of Food in the Last Year: Never true    Ran Out of Food in the Last Year: Never true  Transportation Needs: No Transportation Needs (05/24/2022)   PRAPARE - Hydrologist (Medical): No    Lack of Transportation (Non-Medical): No  Physical Activity: Insufficiently Active (05/24/2022)   Exercise Vital Sign    Days of Exercise per Week: 2 days    Minutes of Exercise per Session: 20 min  Stress: No Stress Concern Present (05/24/2022)   [place]    Feeling of Stress : Not at all  Social Connections: Socially Isolated (05/24/2022)   Social Connection and Isolation Panel [NHANES]    Frequency of Communication with Friends and Family: Once a week    Frequency of Social Gatherings with Friends and Family: Once a week    Attends Religious Services: Never    Building surveyor or Organizations: No    Attends Archivist Meetings: Never    Marital Status: Married  Human resources officer Violence: Not At Risk (05/24/2022)   Humiliation, Afraid, Rape, and Kick questionnaire    Fear of Current or Ex-Partner: No    Emotionally Abused: No    Physically Abused: No    Sexually Abused: No    Outpatient Medications Prior to Visit  Medication Sig Dispense Refill   atorvastatin (LIPITOR) 20 MG tablet Take 1 tablet (20 mg total) by mouth daily. 90 tablet 0   losartan (COZAAR) 50 MG tablet Take 1 tablet (50 mg total) by mouth daily. 90 tablet 0   No facility-administered medications prior to visit.    No Known Allergies  ROS: Pertinent symptoms negative unless otherwise noted in HPI      Objective:    Physical Exam Vitals reviewed.  Constitutional:      Appearance: Normal appearance.  Eyes:     General:        Right eye: No discharge.        Left eye: No discharge.     Conjunctiva/sclera: Conjunctivae normal.  Cardiovascular:     Rate and Rhythm: Normal rate and regular rhythm.  Pulmonary:     Effort: Pulmonary effort is normal. No respiratory distress.  Abdominal:     General: Abdomen is flat. Bowel sounds are normal. There is no distension.     Tenderness: There is no abdominal tenderness.  Musculoskeletal:        General: Normal range of motion.     Cervical back: Normal range of motion.  Neurological:     General: No focal deficit present.     Mental Status: She is alert and oriented to person, place, and time. Mental status is at baseline.  Psychiatric:        Mood and Affect: Mood normal.        Behavior: Behavior normal.        Thought Content: Thought content normal.        Judgment: Judgment normal.       BP (!) 142/100 Comment: right arm  Pulse 92   Temp 97.9 F (36.6 C) (Temporal)   Ht '5\' 4"'$  (1.626 m)   Wt 218 lb 6.4 oz (99.1 kg)   SpO2 99%   BMI 37.49 kg/m  Wt Readings from Last 3 Encounters:  11/22/22  218 lb 6.4 oz (99.1 kg)  05/24/22 216 lb (98 kg)  05/24/22 216  lb 2 oz (98 kg)     Health Maintenance Due  Topic Date Due   DTaP/Tdap/Td (1 - Tdap) Never done   DEXA SCAN  Never done   COVID-19 Vaccine (7 - 2023-24 season) 08/14/2022    There are no preventive care reminders to display for this patient.  Lab Results  Component Value Date   TSH 2.33 09/29/2020   Lab Results  Component Value Date   WBC 6.4 09/29/2020   HGB 14.7 09/29/2020   HCT 42.7 09/29/2020   MCV 95.9 09/29/2020   PLT 216.0 09/29/2020   Lab Results  Component Value Date   NA 135 05/24/2022   K 4.2 05/24/2022   CO2 27 05/24/2022   GLUCOSE 95 05/24/2022   BUN 10 05/24/2022   CREATININE 0.67 05/24/2022   BILITOT 0.9 05/24/2022   ALKPHOS 84 05/24/2022   AST 25 05/24/2022   ALT 25 05/24/2022   PROT 7.7 05/24/2022   ALBUMIN 4.1 05/24/2022   CALCIUM 9.4 05/24/2022   GFR 90.91 05/24/2022   Lab Results  Component Value Date   CHOL 194 05/24/2022   Lab Results  Component Value Date   HDL 45.00 05/24/2022   Lab Results  Component Value Date   LDLCALC 82 05/22/2021   Lab Results  Component Value Date   TRIG 216.0 (H) 05/24/2022   Lab Results  Component Value Date   CHOLHDL 4 05/24/2022   No results found for: "HGBA1C"    Assessment & Plan:   Primary hypertension Assessment & Plan: Increase losartan to 100 mg once daily  Pt advised of the following:  Continue medication as prescribed. Monitor blood pressure periodically and/or when you feel symptomatic. Goal is <130/90 on average. Ensure that you have rested for 30 minutes prior to checking your blood pressure. Record your readings and bring them to your next visit if necessary.work on a low sodium diet.    Orders: -     Losartan Potassium; Take 1 tablet (100 mg total) by mouth daily.  Dispense: 90 tablet; Refill: 3  External hemorrhoids without complication Assessment & Plan: Currently asymptomatic however increase in frequency  of flare ups with discomfort. Will refer to GI as h/o banding on others.   Orders: -     Ambulatory referral to Gastroenterology  Positive fecal occult blood test Assessment & Plan: Referral placed for GI for possible colonoscopy.  Orders: -     CBC -     Ambulatory referral to Gastroenterology  Mixed hyperlipidemia Assessment & Plan: Ordered lipid panel, pending results. Work on low cholesterol diet and exercise as tolerated Continue atorvastatin 20 mg   Orders: -     Lipid panel  On statin therapy -     Hepatic function panel  Vitamin D deficiency Assessment & Plan: Ordered vitamin d pending results.    Orders: -     VITAMIN D 25 Hydroxy (Vit-D Deficiency, Fractures)    Meds ordered this encounter  Medications   losartan (COZAAR) 100 MG tablet    Sig: Take 1 tablet (100 mg total) by mouth daily.    Dispense:  90 tablet    Refill:  3    Order Specific Question:   Supervising Provider    Answer:   Diona Browner, AMY E [id number]    Follow-up: Return in about 6 months (around 05/23/2023) for f/u cholesterol.    Eugenia Pancoast, FNP

## 2022-11-22 NOTE — Assessment & Plan Note (Signed)
Ordered vitamin d pending results.

## 2022-11-22 NOTE — Assessment & Plan Note (Signed)
Ordered lipid panel, pending results. Work on low cholesterol diet and exercise as tolerated Continue atorvastatin 20 mg

## 2022-11-22 NOTE — Patient Instructions (Signed)
  A referral was placed today for GI  Please let us know if you have not heard back within 2 weeks about the referral.  Stop by the lab prior to leaving today. I will notify you of your results once received.   Welcome to our clinic, I am happy to have you as my new patient. I am excited to continue on this healthcare journey with you.  ------------------------------------  Stop by the lab prior to leaving today. I will notify you of your results once received.   Please keep in mind Any my chart messages you send have up to a three business day turnaround for a response.  Phone calls may take up to a one full business day turnaround for a  response.   If you need a medication refill I recommend you request it through the pharmacy as this is easiest for [place] rather than sending a message and or phone call.   Due to recent changes in healthcare laws, you may see results of your imaging and/or laboratory studies on MyChart before I have had a chance to review them.  I understand that in some cases there may be results that are confusing or concerning to you. Please understand that not all results are received at the same time and often I may need to interpret multiple results in order to provide you with the best plan of care or course of treatment. Therefore, I ask that you please give me 2 business days to thoroughly review all your results before contacting my office for clarification. Should we see a critical lab result, you will be contacted sooner.   It was a pleasure seeing you today! Please do not hesitate to reach out with any questions and or concerns.  Regards,   Eugenia Pancoast FNP-C

## 2022-11-22 NOTE — Assessment & Plan Note (Signed)
Referral placed for GI for possible colonoscopy.

## 2022-11-23 ENCOUNTER — Other Ambulatory Visit: Payer: Self-pay | Admitting: Family

## 2022-11-23 DIAGNOSIS — E559 Vitamin D deficiency, unspecified: Secondary | ICD-10-CM

## 2022-11-23 MED ORDER — VITAMIN D (ERGOCALCIFEROL) 1.25 MG (50000 UNIT) PO CAPS: 50000.0000 [IU] | ORAL_CAPSULE | ORAL | 0 refills | Status: AC

## 2022-12-04 ENCOUNTER — Other Ambulatory Visit: Payer: Self-pay

## 2022-12-04 ENCOUNTER — Telehealth: Payer: Self-pay

## 2022-12-04 DIAGNOSIS — R195 Other fecal abnormalities: Secondary | ICD-10-CM

## 2022-12-04 DIAGNOSIS — Z1211 Encounter for screening for malignant neoplasm of colon: Secondary | ICD-10-CM

## 2022-12-04 MED ORDER — NA SULFATE-K SULFATE-MG SULF 17.5-3.13-1.6 GM/177ML PO SOLN: 1.0000 | Freq: Once | ORAL | 0 refills | Status: AC

## 2022-12-04 NOTE — Telephone Encounter (Signed)
Gastroenterology Pre-Procedure Review  Patient was totally awake during her colonoscopy despite being sedated for it.  Please be aware of this.  Office visit after colonoscopy to discuss hemorrhoids. Patient stated she had hemorrhoidectomy in 2014.  Request Date: 01/02/23 Requesting Physician: Dr. Marius Ditch  PATIENT REVIEW QUESTIONS: The patient responded to the following health history questions as indicated:    1. Are you having any GI issues? yes (hemorrhoids, positive fecal occult screening colonoscopy no history of colon polyps) 2. Do you have a personal history of Polyps? no 3. Do you have a family history of Colon Cancer or Polyps? no 4. Diabetes Mellitus? no 5. Joint replacements in the past 12 months?no 6. Major health problems in the past 3 months?no 7. Any artificial heart valves, MVP, or defibrillator?no    MEDICATIONS & ALLERGIES:    Patient reports the following regarding taking any anticoagulation/antiplatelet therapy:   Plavix, Coumadin, Eliquis, Xarelto, Lovenox, Pradaxa, Brilinta, or Effient? no Aspirin? no  Patient confirms/reports the following medications:  Current Outpatient Medications  Medication Sig Dispense Refill   atorvastatin (LIPITOR) 20 MG tablet Take 1 tablet (20 mg total) by mouth daily. 90 tablet 0   losartan (COZAAR) 100 MG tablet Take 1 tablet (100 mg total) by mouth daily. 90 tablet 3   Vitamin D, Ergocalciferol, (DRISDOL) 1.25 MG (50000 UNIT) CAPS capsule Take 1 capsule (50,000 Units total) by mouth every 7 (seven) days. 8 capsule 0   No current facility-administered medications for this visit.    Patient confirms/reports the following allergies:  No Known Allergies  No orders of the defined types were placed in this encounter.   AUTHORIZATION INFORMATION Primary Insurance: 1D#: Group #:  Secondary Insurance: 1D#: Group #:  SCHEDULE INFORMATION: Date: 01/02/23 Time: Location: [facility]

## 2022-12-17 ENCOUNTER — Other Ambulatory Visit: Payer: Self-pay | Admitting: Family

## 2022-12-20 ENCOUNTER — Other Ambulatory Visit: Payer: Self-pay

## 2022-12-20 MED ORDER — ATORVASTATIN CALCIUM 20 MG PO TABS: 20.0000 mg | ORAL_TABLET | Freq: Every day | ORAL | 0 refills | Status: DC

## 2022-12-26 ENCOUNTER — Encounter: Payer: Self-pay | Admitting: Gastroenterology

## 2022-12-29 IMAGING — MG MM DIGITAL SCREENING BILAT W/ TOMO AND CAD
8 series · 8 of 24 positions shown · non-contrast
Comparison: Previous exam(s).

CLINICAL DATA: Screening.

EXAM:
DIGITAL SCREENING BILATERAL MAMMOGRAM WITH TOMOSYNTHESIS AND CAD
TECHNIQUE: Bilateral screening digital craniocaudal and mediolateral oblique
mammograms were obtained. Bilateral screening digital breast
tomosynthesis was performed. The images were evaluated with
computer-aided detection.

[R MLO synth-2D]
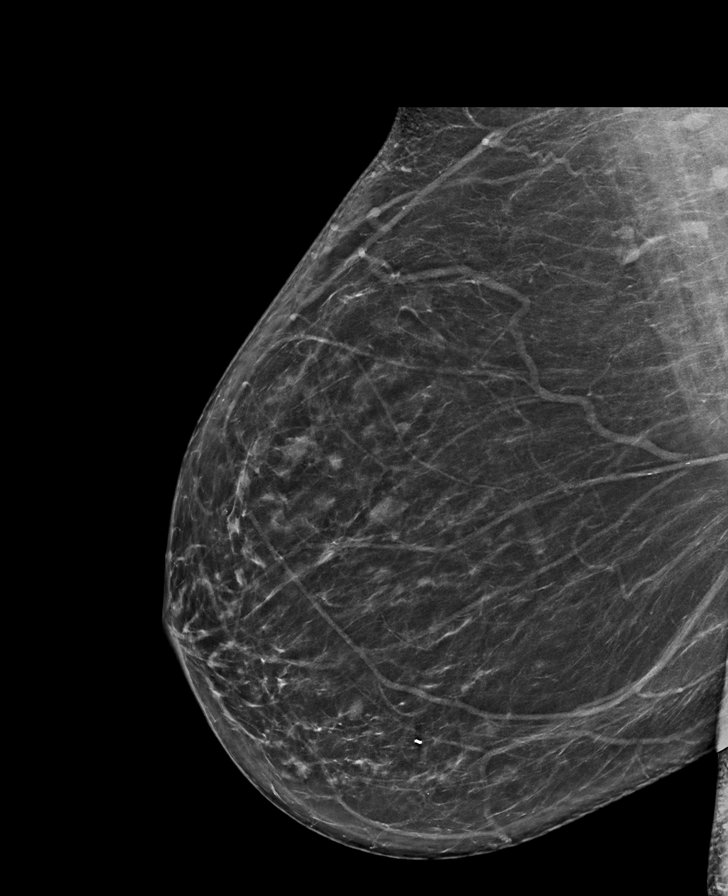

[L MLO synth-2D]
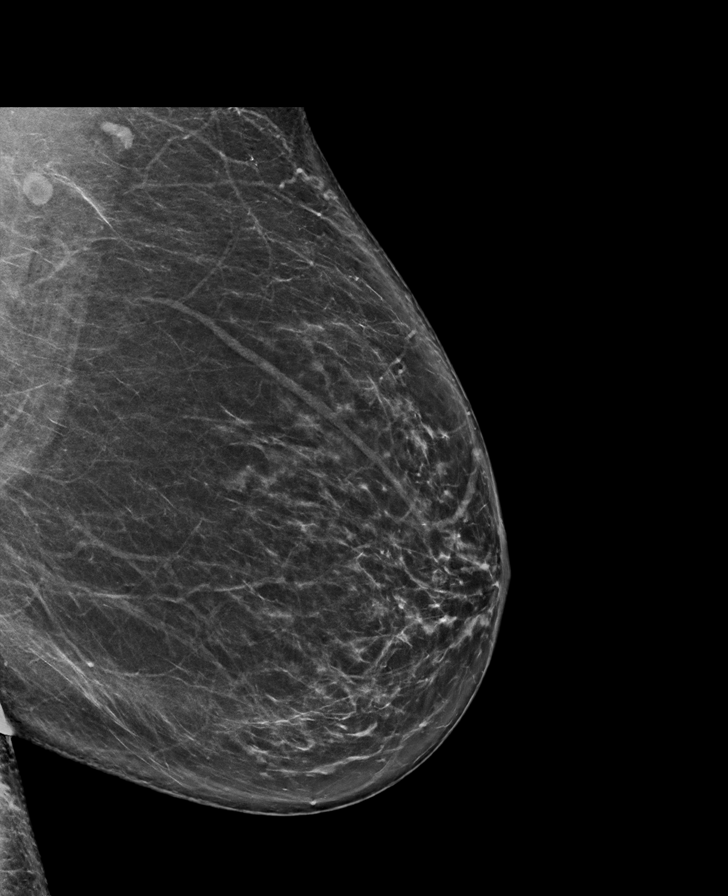

[R CC synth-2D]
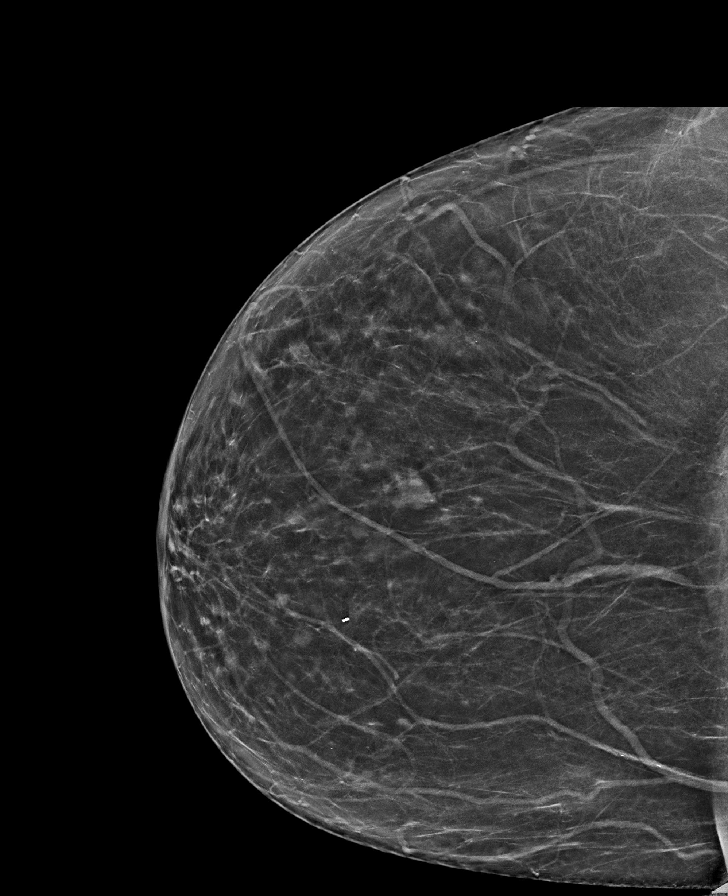

[L CC synth-2D]
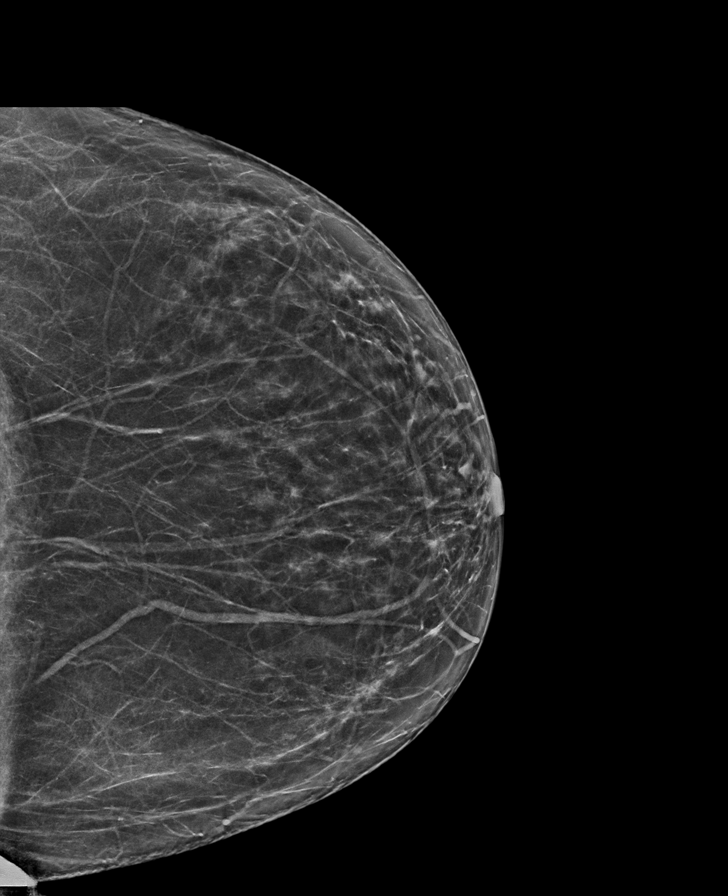

[L MLO tomo · tomo slice 41/81.0]
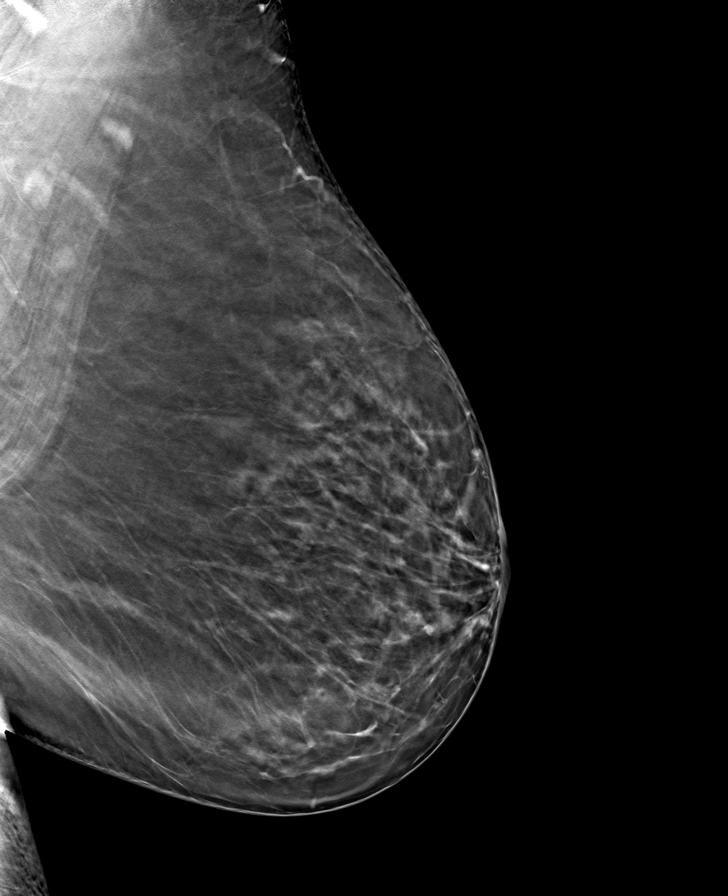

[R CC tomo · tomo slice 36/71.0]
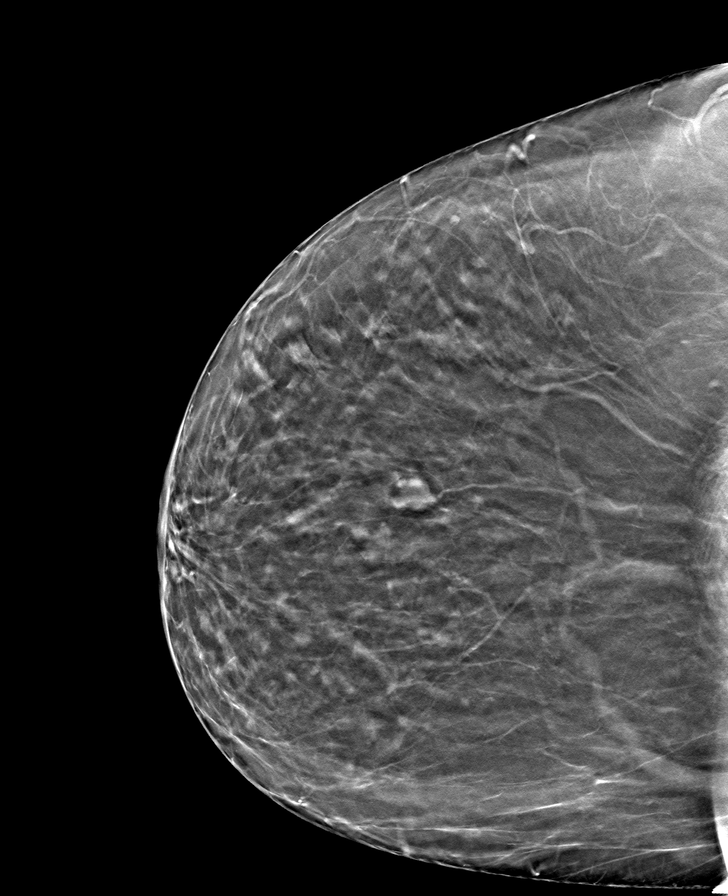

[R MLO tomo · tomo slice 41/80.0]
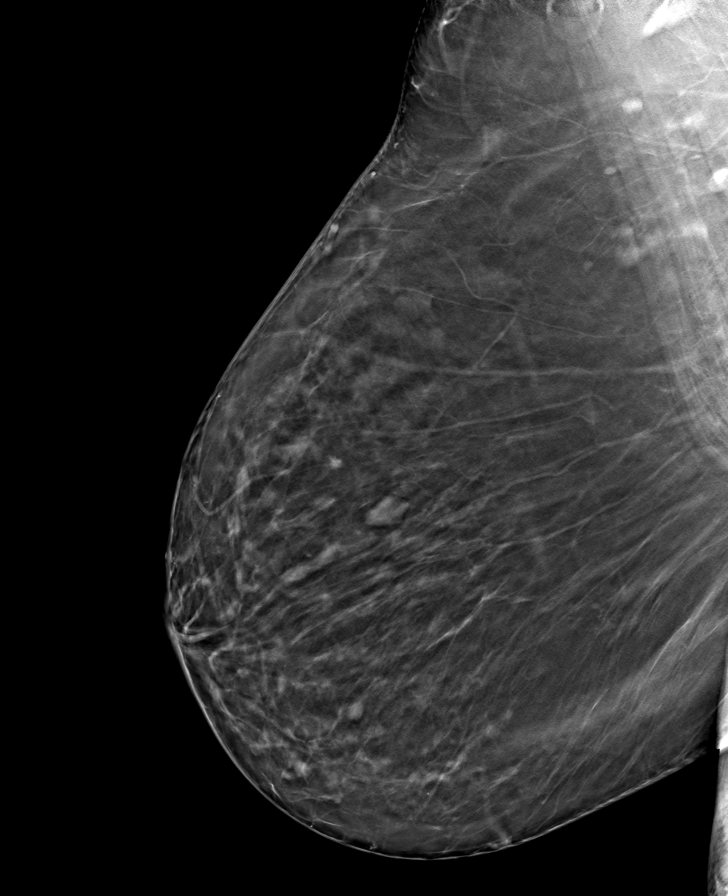

[L CC tomo · tomo slice 37/72.0]
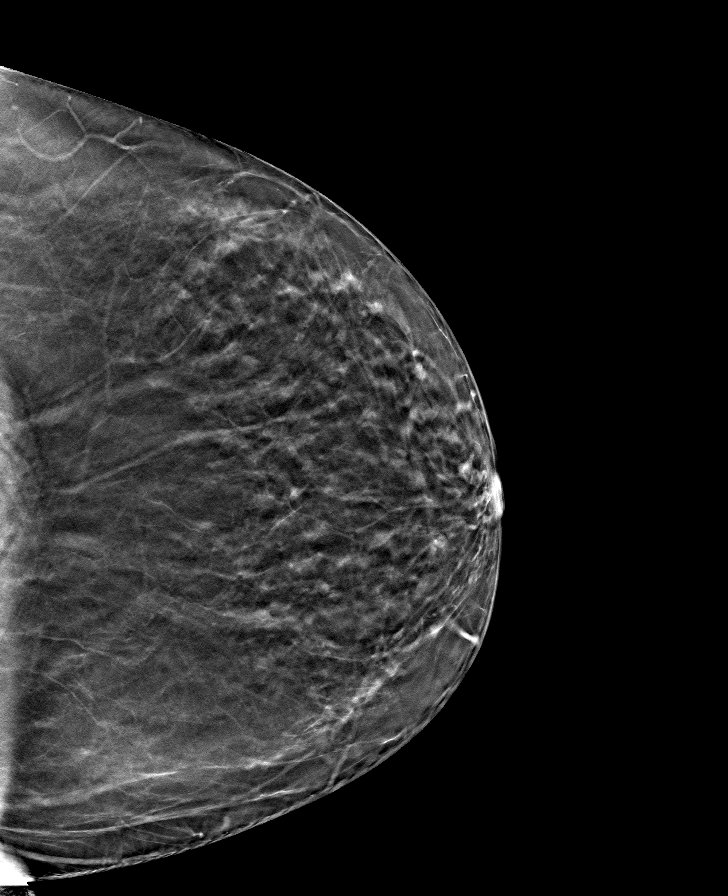

[8 of 24 positions shown; findings below may reference images not displayed]

ACR Breast Density Category b: There are scattered areas of
fibroglandular density.
FINDINGS: In the right breast an asymmetry requires further evaluation.

In the left breast an asymmetry requires further evaluation.
IMPRESSION: Further evaluation is suggested for possible asymmetry in the right
breast.

Further evaluation is suggested for possible asymmetry in the left
breast.

RECOMMENDATION:
Diagnostic mammogram and possibly ultrasound of both breasts.
(Code:S2-4-EER)

The patient will be contacted regarding the findings, and additional
imaging will be scheduled.

BI-RADS CATEGORY  0: Incomplete. Need additional imaging evaluation
and/or prior mammograms for comparison.

## 2023-01-02 ENCOUNTER — Telehealth: Payer: Self-pay

## 2023-01-02 ENCOUNTER — Ambulatory Visit: Payer: Medicare HMO | Admitting: Certified Registered"

## 2023-01-02 ENCOUNTER — Other Ambulatory Visit: Payer: Self-pay

## 2023-01-02 ENCOUNTER — Encounter
Admission: RE | Disposition: A | Discharge status: Home / Self Care | Payer: Self-pay | Source: Ambulatory Visit | Attending: Gastroenterology

## 2023-01-02 ENCOUNTER — Ambulatory Visit
Admission: RE | Admit: 2023-01-02 | Discharge: 2023-01-02 | Disposition: A | Discharge status: Home / Self Care | Payer: Medicare HMO | Source: Ambulatory Visit | Attending: Gastroenterology | Admitting: Gastroenterology

## 2023-01-02 DIAGNOSIS — R195 Other fecal abnormalities: Secondary | ICD-10-CM | POA: Insufficient documentation

## 2023-01-02 DIAGNOSIS — Z1211 Encounter for screening for malignant neoplasm of colon: Secondary | ICD-10-CM

## 2023-01-02 DIAGNOSIS — I10 Essential (primary) hypertension: Secondary | ICD-10-CM | POA: Insufficient documentation

## 2023-01-02 HISTORY — PX: COLONOSCOPY WITH PROPOFOL: SHX5780

## 2023-01-02 SURGERY — COLONOSCOPY WITH PROPOFOL
Anesthesia: General

## 2023-01-02 MED ORDER — SODIUM CHLORIDE 0.9 % IV SOLN
INTRAVENOUS | Status: DC
  Administered 2023-01-02: 20 mL/h via INTRAVENOUS

## 2023-01-02 MED ORDER — PEG 3350-KCL-NA BICARB-NACL 420 G PO SOLR: 4000.0000 mL | Freq: Once | ORAL | 0 refills | Status: AC

## 2023-01-02 MED ORDER — PROPOFOL 10 MG/ML IV BOLUS
INTRAVENOUS | Status: DC | PRN
  Administered 2023-01-02: 150 ug/kg/min via INTRAVENOUS

## 2023-01-02 NOTE — Transfer of Care (Signed)
Immediate Anesthesia Transfer of Care Note  Patient: Evelyn Bell  Procedure(s) Performed: COLONOSCOPY WITH PROPOFOL  Patient Location: PACU  Anesthesia Type:General  Level of Consciousness: awake, alert , and oriented  Airway & Oxygen Therapy: Patient Spontanous Breathing  Post-op Assessment: Report given to RN and Post -op Vital signs reviewed and stable  Post vital signs: stable  Last Vitals:  Vitals Value Taken Time  BP    Temp    Pulse 96 01/02/23 1110  Resp 22 01/02/23 1110  SpO2 100 % 01/02/23 1110  Vitals shown include unvalidated device data.  Last Pain:  Vitals:   01/02/23 1010  TempSrc: Temporal  PainSc: 0-No pain         Complications: No notable events documented.

## 2023-01-02 NOTE — H&P (Signed)
Evelyn Repress, MD [address]  [address]  [place], [place] [address]  Main: [phone]  Fax: [phone] Pager: [phone]  Primary Care Physician:  Mort Sawyers, FNP Primary Gastroenterologist:  Dr. Arlyss Bell  Pre-Procedure History & Physical: HPI:  Evelyn Bell is a 67 y.o. female is here for an colonoscopy.   Past Medical History:  Diagnosis Date   Blood transfusion without reported diagnosis    Hyperlipidemia    Hypertension     Past Surgical History:  Procedure Laterality Date   ABDOMINAL HYSTERECTOMY  1981   still with half an ovary , not sure which side   BREAST CYST ASPIRATION Right 2018   CESAREAN SECTION     HEMORROIDECTOMY     LAPAROSCOPIC GASTRIC SLEEVE RESECTION  2015    Prior to Admission medications   Medication Sig Start Date End Date Taking? Authorizing Provider  atorvastatin (LIPITOR) 20 MG tablet Take 1 tablet (20 mg total) by mouth daily. 12/20/22  Yes Dugal, Wyatt Mage, FNP  losartan (COZAAR) 100 MG tablet Take 1 tablet (100 mg total) by mouth daily. 11/22/22  Yes Dugal, Wyatt Mage, FNP  Vitamin D, Ergocalciferol, (DRISDOL) 1.25 MG (50000 UNIT) CAPS capsule Take 1 capsule (50,000 Units total) by mouth every 7 (seven) days. 11/23/22 01/22/23 Yes Mort Sawyers, FNP    Allergies as of 12/05/2022   (No Known Allergies)    Family History  Problem Relation Age of Onset   Heart disease Mother    Valvular heart disease Mother        passed away surgical complications   Heart disease Sister     Social History   Socioeconomic History   Marital status: Married    Spouse name: Fayrene Fearing   Number of children: 2   Years of education: high school   Highest education level: Not on file  Occupational History   Not on file  Tobacco Use   Smoking status: Never   Smokeless tobacco: Never  Vaping Use   Vaping Use: Never used  Substance and Sexual Activity   Alcohol use: Yes    Comment: 1-2 glasses daily   Drug use: Never   Sexual  activity: Yes    Partners: Male    Birth control/protection: Surgical  Other Topics Concern   Not on file  Social History Narrative   09/15/20   From: [place], moved 2021 to be closer to children   Living: with husband, Fayrene Fearing (1981)   Work: retired from working as Scientist, physiological for [employer]      Family: 2 children - Renae Fickle and Amy - 3 grandchildren      Enjoys: garden, reading      Exercise: not currently, enjoys riding bikes   Diet: pretty healthy      Safety   Seat belts: Yes    Guns: No   Safe in relationships: Yes    Social Determinants of Health   Financial Resource Strain: Low Risk  (05/24/2022)   Overall Financial Resource Strain (CARDIA)    Difficulty of Paying Living Expenses: Not hard at all  Food Insecurity: No Food Insecurity (05/24/2022)   Hunger Vital Sign    Worried About Running Out of Food in the Last Year: Never true    Ran Out of Food in the Last Year: Never true  Transportation Needs: No Transportation Needs (05/24/2022)   PRAPARE - Administrator, Civil Service (Medical): No    Lack of Transportation (Non-Medical): No  Physical Activity: Insufficiently Active (  05/24/2022)   Exercise Vital Sign    Days of Exercise per Week: 2 days    Minutes of Exercise per Session: 20 min  Stress: No Stress Concern Present (05/24/2022)   [employer] of Occupational Health - Occupational Stress Questionnaire    Feeling of Stress : Not at all  Social Connections: Socially Isolated (05/24/2022)   Social Connection and Isolation Panel [NHANES]    Frequency of Communication with Friends and Family: Once a week    Frequency of Social Gatherings with Friends and Family: Once a week    Attends Religious Services: Never    Database administrator or Organizations: No    Attends Banker Meetings: Never    Marital Status: Married  Catering manager Violence: Not At Risk (05/24/2022)   Humiliation, Afraid, Rape, and Kick questionnaire    Fear of Current  or Ex-Partner: No    Emotionally Abused: No    Physically Abused: No    Sexually Abused: No    Review of Systems: See HPI, otherwise negative ROS  Physical Exam: BP (!) 173/107   Pulse (!) 136   Temp (!) 96.5 F (35.8 C) (Temporal)   Resp 20   Ht 5\' 4"  (1.626 m)   Wt 94.6 kg   SpO2 100%   BMI 35.81 kg/m  General:   Alert,  pleasant and cooperative in NAD Head:  Normocephalic and atraumatic. Neck:  Supple; no masses or thyromegaly. Lungs:  Clear throughout to auscultation.    Heart:  Regular rate and rhythm. Abdomen:  Soft, nontender and nondistended. Normal bowel sounds, without guarding, and without rebound.   Neurologic:  Alert and  oriented x4;  grossly normal neurologically.  Impression/Plan: Taeler Almonor is here for an colonoscopy to be performed for fecal occult test positive  Risks, benefits, limitations, and alternatives regarding  colonoscopy have been reviewed with the patient.  Questions have been answered.  All parties agreeable.   Lannette Donath, MD  01/02/2023, 10:39 AM

## 2023-01-02 NOTE — Telephone Encounter (Signed)
Patient contacted to schedule colonoscopy with 2 day prep using Nulytely Prep.  Colonoscopy scheduled with Dr. Allegra Lai on 01/16/23.   2 days prior to procedure on 04/22:  - Do not eat any solid foods or dairy products of any kind after 12 noon.  -  4 pm- Mix 64 ounces of Gatorade with 238 grams of miralax. Drink 8oz every 20 minutes till solution is gone.  - Take your usual prescription medications ( except iron and / or any other stopped medications)  1 day prior to procedure 04/23: - Continue Clear Liquid Diet -5pm Fill Nulytely prep to the fill line. Drink 8 oz every 15-30 mins until entire contents have been completed. -Nothing to eat or drink 4 hours prior to colonoscopy.

## 2023-01-02 NOTE — Anesthesia Preprocedure Evaluation (Signed)
Anesthesia Evaluation  Patient identified by MRN, date of birth, ID band Patient awake    Reviewed: Allergy & Precautions, NPO status , Patient's Chart, lab work & pertinent test results  Airway Mallampati: III  TM Distance: >3 FB Neck ROM: full    Dental  (+) Chipped   Pulmonary neg pulmonary ROS, neg shortness of breath, neg COPD   Pulmonary exam normal        Cardiovascular hypertension, On Medications (-) Past MI negative cardio ROS Normal cardiovascular exam     Neuro/Psych negative neurological ROS  negative psych ROS   GI/Hepatic negative GI ROS, Neg liver ROS,,,  Endo/Other  negative endocrine ROS    Renal/GU negative Renal ROS  negative genitourinary   Musculoskeletal   Abdominal   Peds  Hematology negative hematology ROS (+)   Anesthesia Other Findings Past Medical History: No date: Blood transfusion without reported diagnosis No date: Hyperlipidemia No date: Hypertension  Past Surgical History: 1981: ABDOMINAL HYSTERECTOMY     Comment:  still with half an ovary , not sure which side 2018: BREAST CYST ASPIRATION; Right No date: CESAREAN SECTION No date: HEMORROIDECTOMY 2015: LAPAROSCOPIC GASTRIC SLEEVE RESECTION  BMI    Body Mass Index: 35.81 kg/m      Reproductive/Obstetrics negative OB ROS                             Anesthesia Physical Anesthesia Plan  ASA: 2  Anesthesia Plan: General   Post-op Pain Management: Minimal or no pain anticipated   Induction: Intravenous  PONV Risk Score and Plan: 3 and Propofol infusion, TIVA and Ondansetron  Airway Management Planned: Nasal Cannula  Additional Equipment: None  Intra-op Plan:   Post-operative Plan:   Informed Consent: I have reviewed the patients History and Physical, chart, labs and discussed the procedure including the risks, benefits and alternatives for the proposed anesthesia with the patient or  authorized representative who has indicated his/her understanding and acceptance.     Dental advisory given  Plan Discussed with: CRNA and Surgeon  Anesthesia Plan Comments: (Discussed risks of anesthesia with patient, including possibility of difficulty with spontaneous ventilation under anesthesia necessitating airway intervention, PONV, and rare risks such as cardiac or respiratory or neurological events, and allergic reactions. Discussed the role of CRNA in patient's perioperative care. Patient understands.)       Anesthesia Quick Evaluation

## 2023-01-02 NOTE — Op Note (Signed)
[facility], [facility], [facility] Gastroenterology Patient Name: Evelyn Bell Procedure Date: 01/02/2023 10:43 AM MRN: [id number] Account #: [id number] Date of Birth: 1956-05-29 Admit Type: Outpatient Age: 67 Room: [facility] ENDO ROOM 3 Gender: Female Note Status: Finalized Instrument Name: Colonoscope [id number] Procedure:             Colonoscopy Indications:           Positive fecal immunochemical test Providers:             Toney Reil MD, MD Referring MD:          Mort Sawyers (Referring MD) Medicines:             General Anesthesia Complications:         No immediate complications. Estimated blood loss: None. Procedure:             Pre-Anesthesia Assessment:                        - Prior to the procedure, a History and Physical was                         performed, and patient medications and allergies were                         reviewed. The patient is competent. The risks and                         benefits of the procedure and the sedation options and                         risks were discussed with the patient. All questions                         were answered and informed consent was obtained.                         Patient identification and proposed procedure were                         verified by the physician, the nurse, the                         anesthesiologist, the anesthetist and the technician                         in the pre-procedure area in the procedure room in the                         endoscopy suite. Mental Status Examination: alert and                         oriented. Airway Examination: normal oropharyngeal                         airway and neck mobility. Respiratory Examination:                         clear to auscultation. CV Examination: normal.  Prophylactic Antibiotics: The patient does not require                         prophylactic antibiotics. Prior Anticoagulants: The                         patient  has taken no anticoagulant or antiplatelet                         agents. ASA Grade Assessment: II - A patient with mild                         systemic disease. After reviewing the risks and                         benefits, the patient was deemed in satisfactory                         condition to undergo the procedure. The anesthesia                         plan was to use general anesthesia. Immediately prior                         to administration of medications, the patient was                         re-assessed for adequacy to receive sedatives. The                         heart rate, respiratory rate, oxygen saturations,                         blood pressure, adequacy of pulmonary ventilation, and                         response to care were monitored throughout the                         procedure. The physical status of the patient was                         re-assessed after the procedure.                        After obtaining informed consent, the colonoscope was                         passed under direct vision. Throughout the procedure,                         the patient's blood pressure, pulse, and oxygen                         saturations were monitored continuously. The                         Colonoscope was introduced through the anus with the  intention of advancing to the cecum. The scope was                         advanced to the rectum before the procedure was                         aborted. Medications were given. The colonoscopy was                         extremely difficult due to poor bowel prep with stool                         present. The patient tolerated the procedure well. The                         quality of the bowel preparation was poor. The rectum                         was photographed. The colonoscopy was aborted due to                         the extreme difficulty of the procedure. Findings:      The  perianal and digital rectal examinations were normal. Pertinent       negatives include normal sphincter tone and no palpable rectal lesions.      Copious quantities of stool was found in the recto-sigmoid colon,       precluding visualization.      The retroflexed view of the distal rectum and anal verge was normal and       showed no anal or rectal abnormalities. Impression:            - Preparation of the colon was poor.                        - The procedure was aborted due to the extreme                         difficulty of the procedure.                        - Stool in the recto-sigmoid colon.                        - The distal rectum and anal verge are normal on                         retroflexion view.                        - No specimens collected. Recommendation:        - Discharge patient to home (with escort).                        - Resume previous diet today.                        - Continue present medications.                        -  Repeat colonoscopy at next available appointment                         (within 3 months) because the bowel preparation was                         suboptimal with 2 day prep. Procedure Code(s):     --- Professional ---                        [phone], 53, Colonoscopy, flexible; diagnostic,                         including collection of specimen(s) by brushing or                         washing, when performed (separate procedure) Diagnosis Code(s):     --- Professional ---                        R19.5, Other fecal abnormalities CPT copyright 2022 American Medical Association. All rights reserved. The codes documented in this report are preliminary and upon coder review may  be revised to meet current compliance requirements. Dr. Libby Mawonini [name] [name] [name] MD, MD 01/02/2023 11:09:14 AM This report has been signed electronically. Number of Addenda: 0 Note Initiated On: 01/02/2023 10:43 AM Total Procedure Duration: 0 hours 0  minutes 46 seconds  Estimated Blood Loss:  Estimated blood loss: none.      [facility]

## 2023-01-02 NOTE — Anesthesia Postprocedure Evaluation (Signed)
Anesthesia Post Note  Patient: Evelyn Bell  Procedure(s) Performed: COLONOSCOPY WITH PROPOFOL  Patient location during evaluation: Endoscopy Anesthesia Type: General Level of consciousness: awake and alert Pain management: pain level controlled Vital Signs Assessment: post-procedure vital signs reviewed and stable Respiratory status: spontaneous breathing, nonlabored ventilation, respiratory function stable and patient connected to nasal cannula oxygen Cardiovascular status: blood pressure returned to baseline and stable Postop Assessment: no apparent nausea or vomiting Anesthetic complications: no  There were no known notable events for this encounter.   Last Vitals:  Vitals:   01/02/23 1119 01/02/23 1129  BP:  (!) 146/77  Pulse: 75 67  Resp:    Temp:    SpO2: 100% 100%    Last Pain:  Vitals:   01/02/23 1129  TempSrc:   PainSc: 0-No pain                 Stephanie Coup

## 2023-01-03 ENCOUNTER — Encounter: Payer: Self-pay | Admitting: Gastroenterology

## 2023-01-16 ENCOUNTER — Ambulatory Visit: Payer: Medicare HMO | Admitting: Anesthesiology

## 2023-01-16 ENCOUNTER — Telehealth: Payer: Self-pay

## 2023-01-16 ENCOUNTER — Encounter
Admission: RE | Disposition: A | Discharge status: Home / Self Care | Payer: Self-pay | Source: Home / Self Care | Attending: Gastroenterology

## 2023-01-16 ENCOUNTER — Ambulatory Visit
Admission: RE | Admit: 2023-01-16 | Discharge: 2023-01-16 | Disposition: A | Discharge status: Home / Self Care | Payer: Medicare HMO | Attending: Gastroenterology | Admitting: Gastroenterology

## 2023-01-16 DIAGNOSIS — Z538 Procedure and treatment not carried out for other reasons: Secondary | ICD-10-CM | POA: Insufficient documentation

## 2023-01-16 DIAGNOSIS — Z1211 Encounter for screening for malignant neoplasm of colon: Secondary | ICD-10-CM | POA: Diagnosis not present

## 2023-01-16 DIAGNOSIS — R195 Other fecal abnormalities: Secondary | ICD-10-CM | POA: Insufficient documentation

## 2023-01-16 HISTORY — PX: COLONOSCOPY WITH PROPOFOL: SHX5780

## 2023-01-16 SURGERY — COLONOSCOPY WITH PROPOFOL
Anesthesia: General

## 2023-01-16 MED ORDER — PROPOFOL 500 MG/50ML IV EMUL
INTRAVENOUS | Status: DC | PRN
  Administered 2023-01-16: 75 ug/kg/min via INTRAVENOUS

## 2023-01-16 MED ORDER — SODIUM CHLORIDE 0.9 % IV SOLN
INTRAVENOUS | Status: DC
  Administered 2023-01-16: 1000 mL via INTRAVENOUS

## 2023-01-16 MED ORDER — LABETALOL HCL 5 MG/ML IV SOLN
INTRAVENOUS | Status: AC
  Filled 2023-01-16: qty 4

## 2023-01-16 MED ORDER — LIDOCAINE HCL (CARDIAC) PF 100 MG/5ML IV SOSY
PREFILLED_SYRINGE | INTRAVENOUS | Status: DC | PRN
  Administered 2023-01-16: 50 mg via INTRAVENOUS

## 2023-01-16 MED ORDER — LIDOCAINE HCL (PF) 2 % IJ SOLN
INTRAMUSCULAR | Status: AC
  Filled 2023-01-16: qty 5

## 2023-01-16 MED ORDER — PROPOFOL 10 MG/ML IV BOLUS
INTRAVENOUS | Status: DC | PRN
  Administered 2023-01-16: 80 mg via INTRAVENOUS

## 2023-01-16 MED ORDER — PROPOFOL 10 MG/ML IV BOLUS
INTRAVENOUS | Status: AC
  Filled 2023-01-16: qty 40

## 2023-01-16 MED ORDER — LABETALOL HCL 5 MG/ML IV SOLN
INTRAVENOUS | Status: DC | PRN
  Administered 2023-01-16 (×2): 2.5 mg via INTRAVENOUS

## 2023-01-16 NOTE — Progress Notes (Signed)
Procedure cancelled, rectal exam revealed impacted stool in the proximal rectum Recommend to treat constipation before scheduling colonoscopy  Recommend linzess daily and clinic appt   RV

## 2023-01-16 NOTE — H&P (Signed)
Arlyss Repress, MD [address]  [address]  [place], [place] [address]  Main: [phone]  Fax: [phone] Pager: [phone]  Primary Care Physician:  Mort Sawyers, FNP Primary Gastroenterologist:  Dr. Arlyss Repress  Pre-Procedure History & Physical: HPI:  Evelyn Bell is a 67 y.o. female is here for an colonoscopy.   Past Medical History:  Diagnosis Date   Blood transfusion without reported diagnosis    Hyperlipidemia    Hypertension     Past Surgical History:  Procedure Laterality Date   ABDOMINAL HYSTERECTOMY  1981   still with half an ovary , not sure which side   BREAST CYST ASPIRATION Right 2018   CESAREAN SECTION     COLONOSCOPY WITH PROPOFOL N/A 01/02/2023   Procedure: COLONOSCOPY WITH PROPOFOL;  Surgeon: Toney Reil, MD;  Location: [facility] ENDOSCOPY;  Service: Gastroenterology;  Laterality: N/A;   HEMORROIDECTOMY     LAPAROSCOPIC GASTRIC SLEEVE RESECTION  2015    Prior to Admission medications   Medication Sig Start Date End Date Taking? Authorizing Provider  atorvastatin (LIPITOR) 20 MG tablet Take 1 tablet (20 mg total) by mouth daily. 12/20/22  Yes Dugal, Wyatt Mage, FNP  losartan (COZAAR) 100 MG tablet Take 1 tablet (100 mg total) by mouth daily. 11/22/22  Yes Dugal, Wyatt Mage, FNP  Vitamin D, Ergocalciferol, (DRISDOL) 1.25 MG (50000 UNIT) CAPS capsule Take 1 capsule (50,000 Units total) by mouth every 7 (seven) days. Patient not taking: Reported on 01/16/2023 11/23/22 01/22/23  Mort Sawyers, FNP    Allergies as of 01/03/2023   (No Known Allergies)    Family History  Problem Relation Age of Onset   Heart disease Mother    Valvular heart disease Mother        passed away surgical complications   Heart disease Sister     Social History   Socioeconomic History   Marital status: Married    Spouse name: Fayrene Fearing   Number of children: 2   Years of education: high school   Highest education level: Not on file  Occupational History   Not  on file  Tobacco Use   Smoking status: Never   Smokeless tobacco: Never  Vaping Use   Vaping Use: Never used  Substance and Sexual Activity   Alcohol use: Yes    Comment: 1-2 glasses daily   Drug use: Never   Sexual activity: Yes    Partners: Male    Birth control/protection: Surgical  Other Topics Concern   Not on file  Social History Narrative   09/15/20   From: [place], moved 2021 to be closer to children   Living: with husband, Fayrene Fearing (1981)   Work: retired from working as Scientist, physiological for [employer]      Family: 2 children - Renae Fickle and Amy - 3 grandchildren      Enjoys: garden, reading      Exercise: not currently, enjoys riding bikes   Diet: pretty healthy      Safety   Seat belts: Yes    Guns: No   Safe in relationships: Yes    Social Determinants of Health   Financial Resource Strain: Low Risk  (05/24/2022)   Overall Financial Resource Strain (CARDIA)    Difficulty of Paying Living Expenses: Not hard at all  Food Insecurity: No Food Insecurity (05/24/2022)   Hunger Vital Sign    Worried About Running Out of Food in the Last Year: Never true    Ran Out of Food in the Last Year:  Never true  Transportation Needs: No Transportation Needs (05/24/2022)   PRAPARE - Administrator, Civil Service (Medical): No    Lack of Transportation (Non-Medical): No  Physical Activity: Insufficiently Active (05/24/2022)   Exercise Vital Sign    Days of Exercise per Week: 2 days    Minutes of Exercise per Session: 20 min  Stress: No Stress Concern Present (05/24/2022)   [employer] of Occupational Health - Occupational Stress Questionnaire    Feeling of Stress : Not at all  Social Connections: Socially Isolated (05/24/2022)   Social Connection and Isolation Panel [NHANES]    Frequency of Communication with Friends and Family: Once a week    Frequency of Social Gatherings with Friends and Family: Once a week    Attends Religious Services: Never    Doctor, general practice or Organizations: No    Attends Banker Meetings: Never    Marital Status: Married  Catering manager Violence: Not At Risk (05/24/2022)   Humiliation, Afraid, Rape, and Kick questionnaire    Fear of Current or Ex-Partner: No    Emotionally Abused: No    Physically Abused: No    Sexually Abused: No    Review of Systems: See HPI, otherwise negative ROS  Physical Exam: BP (!) 194/103   Pulse 86   Temp (!) 96.9 F (36.1 C) (Temporal)   Resp 16   Ht  (1.626 m)   Wt 95.9 kg   SpO2 100%   BMI 36.29 kg/m  General:   Alert,  pleasant and cooperative in NAD Head:  Normocephalic and atraumatic. Neck:  Supple; no masses or thyromegaly. Lungs:  Clear throughout to auscultation.    Heart:  Regular rate and rhythm. Abdomen:  Soft, nontender and nondistended. Normal bowel sounds, without guarding, and without rebound.   Neurologic:  Alert and  oriented x4;  grossly normal neurologically.  Impression/Plan: Evelyn Bell is here for an colonoscopy to be performed for FIT positive  Risks, benefits, limitations, and alternatives regarding  colonoscopy have been reviewed with the patient.  Questions have been answered.  All parties agreeable.   Lannette Donath, MD  01/16/2023, 8:56 AM

## 2023-01-16 NOTE — Telephone Encounter (Signed)
Per Dr. Allegra Lai:  She had a stool impaction, canceled her colonoscopy. She will do Dulcolax today. She will come to office to pick up Linzess samples 290 mcg, will start from tomorrow. She will call our office with update in a week, will get x-ray KUB before scheduling her colonoscopy   Put samples up front for Linzess 

## 2023-01-16 NOTE — Transfer of Care (Signed)
Immediate Anesthesia Transfer of Care Note  Patient: Evelyn Bell  Procedure(s) Performed: COLONOSCOPY WITH PROPOFOL  Patient Location: PACU and Endoscopy Unit  Anesthesia Type:General  Level of Consciousness: awake and patient cooperative  Airway & Oxygen Therapy: Patient Spontanous Breathing  Post-op Assessment: Report given to RN and Post -op Vital signs reviewed and stable  Post vital signs: Reviewed and stable  Last Vitals:  Vitals Value Taken Time  BP 144/75 01/16/23 0919  Temp 97   Pulse 82 01/16/23 0919  Resp 17 01/16/23 0919  SpO2 100 % 01/16/23 0919  Vitals shown include unvalidated device data.  Last Pain:  Vitals:   01/16/23 0918  TempSrc: Temporal  PainSc: 0-No pain         Complications: No notable events documented.

## 2023-01-16 NOTE — Anesthesia Preprocedure Evaluation (Signed)
Anesthesia Evaluation  Patient identified by MRN, date of birth, ID band Patient awake    Reviewed: Allergy & Precautions, NPO status , Patient's Chart, lab work & pertinent test results  History of Anesthesia Complications Negative for: history of anesthetic complications  Airway Mallampati: III  TM Distance: >3 FB Neck ROM: full    Dental  (+) Chipped   Pulmonary neg pulmonary ROS, neg shortness of breath, neg sleep apnea, neg COPD, Patient abstained from smoking.Not current smoker   Pulmonary exam normal breath sounds clear to auscultation       Cardiovascular Exercise Tolerance: Good METShypertension, On Medications pulmonary hypertension(-) CAD and (-) Past MI Normal cardiovascular exam(-) dysrhythmias  Rhythm:Regular Rate:Normal - Systolic murmurs    Neuro/Psych negative neurological ROS  negative psych ROS   GI/Hepatic negative GI ROS, Neg liver ROS,neg GERD  ,,  Endo/Other  negative endocrine ROSneg diabetes    Renal/GU negative Renal ROS  negative genitourinary   Musculoskeletal   Abdominal   Peds  Hematology negative hematology ROS (+)   Anesthesia Other Findings Past Medical History: No date: Blood transfusion without reported diagnosis No date: Hyperlipidemia No date: Hypertension  Past Surgical History: 1981: ABDOMINAL HYSTERECTOMY     Comment:  still with half an ovary , not sure which side 2018: BREAST CYST ASPIRATION; Right No date: CESAREAN SECTION No date: HEMORROIDECTOMY 2015: LAPAROSCOPIC GASTRIC SLEEVE RESECTION  BMI    Body Mass Index: 35.81 kg/m      Reproductive/Obstetrics negative OB ROS                              Anesthesia Physical Anesthesia Plan  ASA: 2  Anesthesia Plan: General   Post-op Pain Management: Minimal or no pain anticipated   Induction: Intravenous  PONV Risk Score and Plan: 3 and Propofol infusion, TIVA and  Ondansetron  Airway Management Planned: Nasal Cannula  Additional Equipment: None  Intra-op Plan:   Post-operative Plan:   Informed Consent: I have reviewed the patients History and Physical, chart, labs and discussed the procedure including the risks, benefits and alternatives for the proposed anesthesia with the patient or authorized representative who has indicated his/her understanding and acceptance.     Dental advisory given  Plan Discussed with: CRNA and Surgeon  Anesthesia Plan Comments: (Discussed risks of anesthesia with patient, including possibility of difficulty with spontaneous ventilation under anesthesia necessitating airway intervention, PONV, and rare risks such as cardiac or respiratory or neurological events, and allergic reactions. Discussed the role of CRNA in patient's perioperative care. Patient understands.)        Anesthesia Quick Evaluation

## 2023-01-16 NOTE — Anesthesia Postprocedure Evaluation (Signed)
Anesthesia Post Note  Patient: Evelyn Bell  Procedure(s) Performed: COLONOSCOPY WITH PROPOFOL  Patient location during evaluation: Endoscopy Anesthesia Type: General Level of consciousness: awake and alert Pain management: pain level controlled Vital Signs Assessment: post-procedure vital signs reviewed and stable Respiratory status: spontaneous breathing, nonlabored ventilation, respiratory function stable and patient connected to nasal cannula oxygen Cardiovascular status: blood pressure returned to baseline and stable Postop Assessment: no apparent nausea or vomiting Anesthetic complications: no   No notable events documented.   Last Vitals:  Vitals:   01/16/23 0928 01/16/23 0938  BP: (!) 154/81 (!) 174/91  Pulse:    Resp:    Temp:    SpO2:      Last Pain:  Vitals:   01/16/23 0938  TempSrc:   PainSc: 0-No pain                 Corinda Gubler

## 2023-01-17 ENCOUNTER — Encounter: Payer: Self-pay | Admitting: Gastroenterology

## 2023-02-04 ENCOUNTER — Telehealth: Payer: Self-pay | Admitting: Gastroenterology

## 2023-02-04 NOTE — Telephone Encounter (Signed)
Pt LVMM  to give feedback on linzess samples please return call

## 2023-02-04 NOTE — Telephone Encounter (Signed)
Patient was given Linzess 290 samples after her colonoscopy she states she took the samples and she states she broke out in a rash the second day on the medication and kept the rash the whole time taking the medication. She states the 3rd day she began to feel really space. She states she took it the same time each day and waited 30 minutes before she ate. She states she had diarrhea every day her poop was yellow liquid. She states she finish the medication o Saturday and the rash is going away and does not feel space anymore in the head

## 2023-02-04 NOTE — Telephone Encounter (Signed)
Do not recommend Linzess due to possible allergic reaction.  Recommend to try Trulance after cleanout with MiraLAX bowel prep  RV

## 2023-02-04 NOTE — Telephone Encounter (Signed)
Mix 64 ounces of Gatorade with 238 grams of miralax. Drink 8oz every 20 minutes till solution is gone.  Patient will do the Miralax bowel prep and will come pick up the trulance samples. Places samples at front desk

## 2023-02-19 ENCOUNTER — Telehealth: Payer: Self-pay | Admitting: Gastroenterology

## 2023-02-19 ENCOUNTER — Other Ambulatory Visit: Payer: Self-pay | Admitting: Family

## 2023-02-19 ENCOUNTER — Ambulatory Visit (INDEPENDENT_AMBULATORY_CARE_PROVIDER_SITE_OTHER): Payer: Medicare HMO | Admitting: Family

## 2023-02-19 ENCOUNTER — Encounter: Payer: Self-pay | Admitting: Family

## 2023-02-19 VITALS — BP 136/86 | HR 120 | Temp 97.8°F | Ht 64.0 in | Wt 211.0 lb

## 2023-02-19 DIAGNOSIS — Z780 Asymptomatic menopausal state: Secondary | ICD-10-CM

## 2023-02-19 DIAGNOSIS — Z1231 Encounter for screening mammogram for malignant neoplasm of breast: Secondary | ICD-10-CM

## 2023-02-19 DIAGNOSIS — E559 Vitamin D deficiency, unspecified: Secondary | ICD-10-CM

## 2023-02-19 DIAGNOSIS — K581 Irritable bowel syndrome with constipation: Secondary | ICD-10-CM | POA: Diagnosis not present

## 2023-02-19 DIAGNOSIS — R195 Other fecal abnormalities: Secondary | ICD-10-CM

## 2023-02-19 DIAGNOSIS — I10 Essential (primary) hypertension: Secondary | ICD-10-CM

## 2023-02-19 MED ORDER — ATORVASTATIN CALCIUM 20 MG PO TABS: 20.0000 mg | ORAL_TABLET | Freq: Every day | ORAL | 3 refills | Status: DC

## 2023-02-19 NOTE — Assessment & Plan Note (Signed)
Continue vitamin D3 2000 IU once daily  Will check vitamin D level at CPE

## 2023-02-19 NOTE — Telephone Encounter (Signed)
Pt left message trulance will cost her 700.00 after insurance  was calling to see if something cheaper could be called in

## 2023-02-19 NOTE — Addendum Note (Signed)
Addended by: Alvina Chou on: 02/19/2023 10:49 AM   Modules accepted: Orders

## 2023-02-19 NOTE — Patient Instructions (Signed)
  I have sent an electronic order over to your preferred location for the following:   []  2D Mammogram  [x]  3D Mammogram  [x]  Bone Density   Please give this center a call to get scheduled at your convenience.  [x]  [facility]  [address]   [place] [address]  [phone]  Make sure to wear two piece  clothing  No lotions powders or deodorants the day of the appointment Make sure to bring picture ID and insurance card.  Bring list of medications you are currently taking including any supplements.    Regards,   Angeldejesus Callaham FNP-C   

## 2023-02-19 NOTE — Progress Notes (Signed)
Established Patient Office Visit  Subjective:      CC:  Chief Complaint  Patient presents with   Medical Management of Chronic Issues    HPI: Evelyn Bell is a 67 y.o. female presenting on 02/19/2023 for Medical Management of Chronic Issues . HTN: last visit in feb increased losartan to 100 mg once daily. Average blood pressure at home seems to be around mid 130's over 80 or so. Has switched her diet to high fiber which she has increased her vegetables instead of carbs   Positive IFOB, referral placed in feb 2024 to Gi for possible colonoscopy.  IBS with constipation: was unable to complete either of her two colonoscopies due to constipation, it was difficult for GI to complete exam. She was started on linzess but had an allergic reaction to it so was recommended another mediation however 700$ a month. She has not yet let her GI doctor know. She has increased fiber to 30 mg daily and now having daily regular bowel movements.   HLD: on atorvastatin 20 mg once nightly, tolerating well. Denies myalgias.  Lab Results  Component Value Date   CHOL 159 11/22/2022   HDL 40.70 11/22/2022   LDLCALC 89 11/22/2022   LDLDIRECT 118.0 05/24/2022   TRIG 145.0 11/22/2022   CHOLHDL 4 11/22/2022    Vitamin D def:  Last vitamin D Lab Results  Component Value Date   VD25OH 20.82 (L) 11/22/2022         Social history:  Relevant past medical, surgical, family and social history reviewed and updated as indicated. Interim medical history since our last visit reviewed.  Allergies and medications reviewed and updated.  DATA REVIEWED: CHART IN EPIC     ROS: Negative unless specifically indicated above in HPI.    Current Outpatient Medications:    atorvastatin (LIPITOR) 20 MG tablet, Take 1 tablet (20 mg total) by mouth daily., Disp: 90 tablet, Rfl: 0   cholecalciferol (VITAMIN D3) 25 MCG (1000 UNIT) tablet, Take 1,000 Units by mouth daily., Disp: , Rfl:    losartan (COZAAR)  100 MG tablet, Take 1 tablet (100 mg total) by mouth daily., Disp: 90 tablet, Rfl: 3      Objective:    BP 136/86 (BP Location: Right Arm)   Pulse (!) 120   Temp 97.8 F (36.6 C) (Temporal)   Ht 5\' 4"  (1.626 m)   Wt 211 lb (95.7 kg)   SpO2 99%   BMI 36.22 kg/m   Wt Readings from Last 3 Encounters:  02/19/23 211 lb (95.7 kg)  01/16/23 211 lb 6.7 oz (95.9 kg)  01/02/23 208 lb 9.6 oz (94.6 kg)    Physical Exam  Physical Exam Constitutional:      General: not in acute distress.    Appearance: Normal appearance. normal weight. is not ill-appearing, toxic-appearing or diaphoretic.  Cardiovascular:     Rate and Rhythm: Normal rate.  Pulmonary:     Effort: Pulmonary effort is normal.  Musculoskeletal:        General: Normal range of motion.  Neurological:     General: No focal deficit present.     Mental Status: alert and oriented to person, place, and time. Mental status is at baseline.  Psychiatric:        Mood and Affect: Mood normal.        Behavior: Behavior normal.        Thought Content: Thought content normal.        Judgment: Judgment normal.  Assessment & Plan:  Primary hypertension Assessment & Plan: Continue losartan 100 mg once daily    Orders: -     Microalbumin / creatinine urine ratio  Screening mammogram for breast cancer -     3D Screening Mammogram, Left and Right; Future  Postmenopausal -     DG Bone Density; Future  Vitamin D deficiency Assessment & Plan: Continue vitamin D3 2000 IU once daily  Will check vitamin D level at CPE   Positive fecal occult blood test Assessment & Plan: F/u with GI    Irritable bowel syndrome with constipation Assessment & Plan: Continue high fiber diet  Continue f/u with Gi       Return in about 6 months (around 08/22/2023) for f/u CPE.  Mort Sawyers, MSN, APRN, FNP-C [employer] [facility]

## 2023-02-19 NOTE — Assessment & Plan Note (Signed)
F/u with GI  

## 2023-02-19 NOTE — Assessment & Plan Note (Signed)
Continue losartan 100 mg once daily.   

## 2023-02-19 NOTE — Assessment & Plan Note (Signed)
Continue high fiber diet  Continue f/u with Gi

## 2023-02-20 MED ORDER — LUBIPROSTONE 24 MCG PO CAPS: 24.0000 ug | ORAL_CAPSULE | Freq: Two times a day (BID) | ORAL | 0 refills | Status: DC

## 2023-02-20 NOTE — Addendum Note (Signed)
Addended by: Radene Knee L on: 02/20/2023 04:03 PM   Modules accepted: Orders

## 2023-02-20 NOTE — Telephone Encounter (Signed)
Sent medication to the pharmacy. Called and left a message for call back  

## 2023-02-20 NOTE — Telephone Encounter (Signed)
Amitiza 24 mcg twice daily for 1 month and let's see if it helps  RV

## 2023-02-21 NOTE — Telephone Encounter (Signed)
Called and left  a message for call back sent mychart message  

## 2023-03-17 ENCOUNTER — Other Ambulatory Visit: Payer: Self-pay | Admitting: Gastroenterology

## 2023-04-05 ENCOUNTER — Ambulatory Visit
Admission: RE | Admit: 2023-04-05 | Discharge: 2023-04-05 | Disposition: A | Discharge status: Home / Self Care | Payer: Medicare HMO | Source: Ambulatory Visit | Attending: Family | Admitting: Family

## 2023-04-05 DIAGNOSIS — Z780 Asymptomatic menopausal state: Secondary | ICD-10-CM | POA: Insufficient documentation

## 2023-04-05 DIAGNOSIS — Z1231 Encounter for screening mammogram for malignant neoplasm of breast: Secondary | ICD-10-CM

## 2023-05-09 ENCOUNTER — Encounter (INDEPENDENT_AMBULATORY_CARE_PROVIDER_SITE_OTHER): Payer: Self-pay

## 2023-05-22 ENCOUNTER — Ambulatory Visit: Payer: Medicare HMO | Admitting: Family

## 2023-05-28 ENCOUNTER — Ambulatory Visit (INDEPENDENT_AMBULATORY_CARE_PROVIDER_SITE_OTHER): Payer: Medicare HMO

## 2023-05-28 VITALS — Wt 204.0 lb

## 2023-05-28 DIAGNOSIS — Z0000 Encounter for general adult medical examination without abnormal findings: Secondary | ICD-10-CM

## 2023-05-28 NOTE — Patient Instructions (Signed)
Evelyn Bell , Thank you for taking time to come for your Medicare Wellness Visit. I appreciate your ongoing commitment to your health goals. Please review the following plan we discussed and let me know if I can assist you in the future.   Referrals/Orders/Follow-Ups/Clinician Recommendations: Aim for 30 minutes of exercise or brisk walking, 6-8 glasses of water, and 5 servings of fruits and vegetables each day.   This is a list of the screening recommended for you and due dates:  Health Maintenance  Topic Date Due   DTaP/Tdap/Td vaccine (1 - Tdap) Never done   Zoster (Shingles) Vaccine (1 of 2) Never done   Flu Shot  04/25/2023   Medicare Annual Wellness Visit  05/25/2023   COVID-19 Vaccine (7 - 2023-24 season) 05/26/2023   Mammogram  04/04/2024   Colon Cancer Screening  01/15/2033   Pneumonia Vaccine  Completed   DEXA scan (bone density measurement)  Completed   Hepatitis C Screening  Completed   HPV Vaccine  Aged Out    Advanced directives: (Declined) Advance directive discussed with you today. Even though you declined this today, please call our office should you change your mind, and we can give you the proper paperwork for you to fill out.  Next Medicare Annual Wellness Visit scheduled for next year: Yes

## 2023-05-28 NOTE — Progress Notes (Signed)
Subjective:   Evelyn Bell is a 67 y.o. female who presents for Medicare Annual (Subsequent) preventive examination.  Visit Complete: Virtual  I connected with  Sheilla Weisbrot on 05/28/23 by a audio enabled telemedicine application and verified that I am speaking with the correct person using two identifiers.  Patient Location: Home  Provider Location: Home Office  I discussed the limitations of evaluation and management by telemedicine. The patient expressed understanding and agreed to proceed.  Patient Medicare AWV questionnaire was completed by the patient on 04/26/23; I have confirmed that all information answered by patient is correct and no changes since this date.  Vital Signs: Because this visit was a virtual/telehealth visit, some criteria may be missing or patient reported. Any vitals not documented were not able to be obtained and vitals that have been documented are patient reported.    Review of Systems      Cardiac Risk Factors include: advanced age (>62men, >53 women);hypertension;sedentary lifestyle;dyslipidemia     Objective:    Today's Vitals   05/28/23 1433  Weight: 204 lb (92.5 kg)   Body mass index is 35.02 kg/m.     05/28/2023    2:38 PM 01/16/2023    8:27 AM 01/02/2023   10:09 AM 05/24/2022    2:14 PM 05/22/2021    8:56 AM  Advanced Directives  Does Patient Have a Medical Advance Directive? No No No No No  Would patient like information on creating a medical advance directive? No - Patient declined   No - Patient declined Yes (MAU/Ambulatory/Procedural Areas - Information given)    Current Medications (verified) Outpatient Encounter Medications as of 05/28/2023  Medication Sig   atorvastatin (LIPITOR) 20 MG tablet Take 1 tablet (20 mg total) by mouth daily.   cholecalciferol (VITAMIN D3) 25 MCG (1000 UNIT) tablet Take 1,000 Units by mouth daily.   losartan (COZAAR) 100 MG tablet Take 1 tablet (100 mg total) by mouth daily.   lubiprostone  (AMITIZA) 24 MCG capsule TAKE 1 CAPSULE (24 MCG TOTAL) BY MOUTH 2 (TWO) TIMES DAILY WITH A MEAL.   No facility-administered encounter medications on file as of 05/28/2023.    Allergies (verified) Patient has no known allergies.   History: Past Medical History:  Diagnosis Date   Blood transfusion without reported diagnosis    Hyperlipidemia    Hypertension    Past Surgical History:  Procedure Laterality Date   ABDOMINAL HYSTERECTOMY  1981   still with half an ovary , not sure which side   BREAST CYST ASPIRATION Right 2018   CESAREAN SECTION     COLONOSCOPY WITH PROPOFOL N/A 01/02/2023   Procedure: COLONOSCOPY WITH PROPOFOL;  Surgeon: Toney Reil, MD;  Location: [facility] ENDOSCOPY;  Service: Gastroenterology;  Laterality: N/A;   COLONOSCOPY WITH PROPOFOL N/A 01/16/2023   Procedure: COLONOSCOPY WITH PROPOFOL;  Surgeon: Toney Reil, MD;  Location: [facility] ENDOSCOPY;  Service: Gastroenterology;  Laterality: N/A;   HEMORROIDECTOMY     LAPAROSCOPIC GASTRIC SLEEVE RESECTION  2015   Family History  Problem Relation Age of Onset   Heart disease Mother    Valvular heart disease Mother        passed away surgical complications   Heart disease Sister    Social History   Socioeconomic History   Marital status: Married    Spouse name: Fayrene Fearing   Number of children: 2   Years of education: high school   Highest education level: Not on file  Occupational History   Not on file  Tobacco Use   Smoking status: Never   Smokeless tobacco: Never  Vaping Use   Vaping status: Never Used  Substance and Sexual Activity   Alcohol use: Yes    Comment: 1-2 glasses daily   Drug use: Never   Sexual activity: Yes    Partners: Male    Birth control/protection: Surgical  Other Topics Concern   Not on file  Social History Narrative   09/15/20   From: [place], moved 2021 to be closer to children   Living: with husband, Fayrene Fearing (1981)   Work: retired from working as Scientist, physiological for [employer]       Family: 2 children - Renae Fickle and Amy - 3 grandchildren      Enjoys: garden, reading      Exercise: not currently, enjoys riding bikes   Diet: pretty healthy      Safety   Seat belts: Yes    Guns: No   Safe in relationships: Yes    Social Determinants of Health   Financial Resource Strain: Low Risk  (05/27/2023)   Overall Financial Resource Strain (CARDIA)    Difficulty of Paying Living Expenses: Not hard at all  Food Insecurity: No Food Insecurity (05/27/2023)   Hunger Vital Sign    Worried About Running Out of Food in the Last Year: Never true    Ran Out of Food in the Last Year: Never true  Transportation Needs: No Transportation Needs (05/27/2023)   PRAPARE - Administrator, Civil Service (Medical): No    Lack of Transportation (Non-Medical): No  Physical Activity: Insufficiently Active (05/27/2023)   Exercise Vital Sign    Days of Exercise per Week: 7 days    Minutes of Exercise per Session: 20 min  Stress: No Stress Concern Present (05/27/2023)   [employer] of Occupational Health - Occupational Stress Questionnaire    Feeling of Stress : Not at all  Social Connections: Moderately Isolated (05/27/2023)   Social Connection and Isolation Panel [NHANES]    Frequency of Communication with Friends and Family: More than three times a week    Frequency of Social Gatherings with Friends and Family: Once a week    Attends Religious Services: Never    Database administrator or Organizations: No    Attends Engineer, structural: Never    Marital Status: Married    Tobacco Counseling Counseling given: Not Answered   Clinical Intake:  Pre-visit preparation completed: Yes  Pain : No/denies pain     BMI - recorded: 35.02 Nutritional Status: BMI > 30  Obese Nutritional Risks: None Diabetes: No  How often do you need to have someone help you when you read instructions, pamphlets, or other written materials from your doctor or pharmacy?: 1 -  Never  Interpreter Needed?: No  Information entered by :: C.Sriman Tally LPN   Activities of Daily Living    05/27/2023    7:40 PM  In your present state of health, do you have any difficulty performing the following activities:  Hearing? 0  Vision? 0  Difficulty concentrating or making decisions? 0  Walking or climbing stairs? 0  Dressing or bathing? 0  Doing errands, shopping? 0  Preparing Food and eating ? N  Using the Toilet? N  In the past six months, have you accidently leaked urine? N  Do you have problems with loss of bowel control? N  Managing your Medications? N  Managing your Finances? N    Patient Care Team: Mort Sawyers,  FNP as PCP - General (Family Medicine) Deirdre Evener, MD (Dermatology)  Indicate any recent Medical Services you may have received from other than [facility] providers in the past year (date may be approximate).     Assessment:   This is a routine wellness examination for Clarine.  Hearing/Vision screen Hearing Screening - Comments:: Denies hearing difficulties   Vision Screening - Comments:: Glasses - [facility] - UTD on exams  Dietary issues and exercise activities discussed:     Goals Addressed             This Visit's Progress    Patient Stated       Maintain current health status       Depression Screen    02/19/2023    9:13 AM 11/22/2022    8:39 AM 05/24/2022    2:13 PM 05/24/2022   10:30 AM 05/22/2021   10:41 AM 05/22/2021    8:59 AM 09/15/2020    9:59 AM  PHQ 2/9 Scores  PHQ - 2 Score 0 0 0 0 0 0 0  PHQ- 9 Score  0 0        Fall Risk    05/27/2023    7:40 PM 02/19/2023    9:13 AM 11/22/2022    8:38 AM 05/24/2022    2:15 PM 05/24/2022    9:53 AM  Fall Risk   Falls in the past year? 0 0 0 0 0  Number falls in past yr: 0  0 0 0  Injury with Fall? 0 0 0 0   Risk for fall due to : No Fall Risks   No Fall Risks   Follow up Falls prevention discussed;Falls evaluation completed Falls evaluation completed;Education  provided;Falls prevention discussed Falls prevention discussed;Education provided;Falls evaluation completed Falls prevention discussed;Falls evaluation completed     MEDICARE RISK AT HOME: Medicare Risk at Home Any stairs in or around the home?: Yes If so, are there any without handrails?: No Home free of loose throw rugs in walkways, pet beds, electrical cords, etc?: Yes Adequate lighting in your home to reduce risk of falls?: Yes Life alert?: No Use of a cane, walker or w/c?: No Grab bars in the bathroom?: No Shower chair or bench in shower?: Yes Elevated toilet seat or a handicapped toilet?: No  TIMED UP AND GO:  Was the test performed?  No    Cognitive Function:        05/28/2023    2:40 PM 05/24/2022    2:16 PM  6CIT Screen  What Year? 0 points 0 points  What month? 0 points 0 points  What time? 0 points 0 points  Count back from 20 0 points 0 points  Months in reverse 0 points 0 points  Repeat phrase 0 points 0 points  Total Score 0 points 0 points    Immunizations Immunization History  Administered Date(s) Administered   COVID-19, mRNA, vaccine(Comirnaty)12 years and older 06/19/2022   Influenza,inj,Quad PF,6+ Mos 05/27/2020, 05/22/2021, 05/24/2022   PFIZER(Purple Top)SARS-COV-2 Vaccination 12/11/2019, 01/01/2020, 09/12/2020, 02/15/2021   PNEUMOCOCCAL CONJUGATE-20 05/24/2022   Pfizer Covid-19 Vaccine Bivalent Booster 66yrs & up 06/26/2021    TDAP status: Due, Education has been provided regarding the importance of this vaccine. Advised may receive this vaccine at local pharmacy or Health Dept. Aware to provide a copy of the vaccination record if obtained from local pharmacy or Health Dept. Verbalized acceptance and understanding.  Flu Vaccine status: Due, Education has been provided regarding the importance of this  vaccine. Advised may receive this vaccine at local pharmacy or Health Dept. Aware to provide a copy of the vaccination record if obtained from local  pharmacy or Health Dept. Verbalized acceptance and understanding.  Pneumococcal vaccine status: Up to date  Covid-19 vaccine status: Information provided on how to obtain vaccines.   Qualifies for Shingles Vaccine? Yes   Zostavax completed No   Shingrix Completed?: No.    Education has been provided regarding the importance of this vaccine. Patient has been advised to call insurance company to determine out of pocket expense if they have not yet received this vaccine. Advised may also receive vaccine at local pharmacy or Health Dept. Verbalized acceptance and understanding.  Screening Tests Health Maintenance  Topic Date Due   DTaP/Tdap/Td (1 - Tdap) Never done   Zoster Vaccines- Shingrix (1 of 2) Never done   INFLUENZA VACCINE  04/25/2023   COVID-19 Vaccine (7 - 2023-24 season) 05/26/2023   MAMMOGRAM  04/04/2024   Medicare Annual Wellness (AWV)  05/27/2024   Colonoscopy  01/15/2033   Pneumonia Vaccine 2+ Years old  Completed   DEXA SCAN  Completed   Hepatitis C Screening  Completed   HPV VACCINES  Aged Out    Health Maintenance  Health Maintenance Due  Topic Date Due   DTaP/Tdap/Td (1 - Tdap) Never done   Zoster Vaccines- Shingrix (1 of 2) Never done   INFLUENZA VACCINE  04/25/2023   COVID-19 Vaccine (7 - 2023-24 season) 05/26/2023    Colorectal cancer screening: Type of screening: Colonoscopy. Completed 01/16/23. Repeat every 10 years  Mammogram status: Completed 04/05/23. Repeat every year  Bone Density status: Completed 04/05/23. Results reflect: Bone density results: NORMAL. Repeat every 5 years.  Lung Cancer Screening: (Low Dose CT Chest recommended if Age 80-80 years, 20 pack-year currently smoking OR have quit w/in 15years.) does not qualify.   Lung Cancer Screening Referral:    Additional Screening:  Hepatitis C Screening: does qualify; Completed 09/29/20  Vision Screening: Recommended annual ophthalmology exams for early detection of glaucoma and other  disorders of the eye. Is the patient up to date with their annual eye exam?  Yes  Who is the provider or what is the name of the office in which the patient attends annual eye exams? [facility] If pt is not established with a provider, would they like to be referred to a provider to establish care? Yes .   Dental Screening: Recommended annual dental exams for proper oral hygiene  Diabetic Foot Exam:   Community Resource Referral / Chronic Care Management: CRR required this visit?  No   CCM required this visit?  No     Plan:     I have personally reviewed and noted the following in the patient's chart:   Medical and social history Use of alcohol, tobacco or illicit drugs  Current medications and supplements including opioid prescriptions. Patient is not currently taking opioid prescriptions. Functional ability and status Nutritional status Physical activity Advanced directives List of other physicians Hospitalizations, surgeries, and ER visits in previous 12 months Vitals Screenings to include cognitive, depression, and falls Referrals and appointments  In addition, I have reviewed and discussed with patient certain preventive protocols, quality metrics, and best practice recommendations. A written personalized care plan for preventive services as well as general preventive health recommendations were provided to patient.     Maryan Puls, LPN   6/0/4540   After Visit Summary: (MyChart) Due to this being a telephonic visit, the  after visit summary with patients personalized plan was offered to patient via MyChart   Nurse Notes: none

## 2023-06-10 NOTE — Progress Notes (Signed)
Subjective:   Evelyn Bell is a 67 y.o. female who presents for Medicare Annual (Subsequent) preventive examination.  Visit Complete: Virtual  I connected with  Evelyn Bell on 06/10/23 by a audio enabled telemedicine application and verified that I am speaking with the correct person using two identifiers.  Patient Location: Home  Provider Location: Home Office  I discussed the limitations of evaluation and management by telemedicine. The patient expressed understanding and agreed to proceed.  Patient Medicare AWV questionnaire was completed by the patient on 04/26/23; I have confirmed that all information answered by patient is correct and no changes since this date.  Vital Signs: Because this visit was a virtual/telehealth visit, some criteria may be missing or patient reported. Any vitals not documented were not able to be obtained and vitals that have been documented are patient reported.    Review of Systems      Cardiac Risk Factors include: advanced age (>40men, >75 women);hypertension;sedentary lifestyle;dyslipidemia     Objective:    Today's Vitals   05/28/23 1433  Weight: 204 lb (92.5 kg)   Body mass index is 35.02 kg/m.     05/28/2023    2:38 PM 01/16/2023    8:27 AM 01/02/2023   10:09 AM 05/24/2022    2:14 PM 05/22/2021    8:56 AM  Advanced Directives  Does Patient Have a Medical Advance Directive? No No No No No  Would patient like information on creating a medical advance directive? No - Patient declined   No - Patient declined Yes (MAU/Ambulatory/Procedural Areas - Information given)    Current Medications (verified) Outpatient Encounter Medications as of 05/28/2023  Medication Sig   atorvastatin (LIPITOR) 20 MG tablet Take 1 tablet (20 mg total) by mouth daily.   cholecalciferol (VITAMIN D3) 25 MCG (1000 UNIT) tablet Take 1,000 Units by mouth daily.   losartan (COZAAR) 100 MG tablet Take 1 tablet (100 mg total) by mouth daily.   lubiprostone  (AMITIZA) 24 MCG capsule TAKE 1 CAPSULE (24 MCG TOTAL) BY MOUTH 2 (TWO) TIMES DAILY WITH A MEAL.   No facility-administered encounter medications on file as of 05/28/2023.    Allergies (verified) Patient has no known allergies.   History: Past Medical History:  Diagnosis Date   Blood transfusion without reported diagnosis    Hyperlipidemia    Hypertension    Past Surgical History:  Procedure Laterality Date   ABDOMINAL HYSTERECTOMY  1981   still with half an ovary , not sure which side   BREAST CYST ASPIRATION Right 2018   CESAREAN SECTION     COLONOSCOPY WITH PROPOFOL N/A 01/02/2023   Procedure: COLONOSCOPY WITH PROPOFOL;  Surgeon: Toney Reil, MD;  Location: [facility] ENDOSCOPY;  Service: Gastroenterology;  Laterality: N/A;   COLONOSCOPY WITH PROPOFOL N/A 01/16/2023   Procedure: COLONOSCOPY WITH PROPOFOL;  Surgeon: Toney Reil, MD;  Location: [facility] ENDOSCOPY;  Service: Gastroenterology;  Laterality: N/A;   HEMORROIDECTOMY     LAPAROSCOPIC GASTRIC SLEEVE RESECTION  2015   Family History  Problem Relation Age of Onset   Heart disease Mother    Valvular heart disease Mother        passed away surgical complications   Heart disease Sister    Social History   Socioeconomic History   Marital status: Married    Spouse name: Fayrene Fearing   Number of children: 2   Years of education: high school   Highest education level: Not on file  Occupational History   Not on file  Tobacco Use   Smoking status: Never   Smokeless tobacco: Never  Vaping Use   Vaping status: Never Used  Substance and Sexual Activity   Alcohol use: Yes    Comment: 1-2 glasses daily   Drug use: Never   Sexual activity: Yes    Partners: Male    Birth control/protection: Surgical  Other Topics Concern   Not on file  Social History Narrative   09/15/20   From: [place], moved 2021 to be closer to children   Living: with husband, Fayrene Fearing (1981)   Work: retired from working as Scientist, physiological for [employer]       Family: 2 children - Renae Fickle and Amy - 3 grandchildren      Enjoys: garden, reading      Exercise: not currently, enjoys riding bikes   Diet: pretty healthy      Safety   Seat belts: Yes    Guns: No   Safe in relationships: Yes    Social Determinants of Health   Financial Resource Strain: Low Risk  (05/27/2023)   Overall Financial Resource Strain (CARDIA)    Difficulty of Paying Living Expenses: Not hard at all  Food Insecurity: No Food Insecurity (05/27/2023)   Hunger Vital Sign    Worried About Running Out of Food in the Last Year: Never true    Ran Out of Food in the Last Year: Never true  Transportation Needs: No Transportation Needs (05/27/2023)   PRAPARE - Administrator, Civil Service (Medical): No    Lack of Transportation (Non-Medical): No  Physical Activity: Insufficiently Active (05/27/2023)   Exercise Vital Sign    Days of Exercise per Week: 7 days    Minutes of Exercise per Session: 20 min  Stress: No Stress Concern Present (05/27/2023)   [employer] of Occupational Health - Occupational Stress Questionnaire    Feeling of Stress : Not at all  Social Connections: Moderately Isolated (05/27/2023)   Social Connection and Isolation Panel [NHANES]    Frequency of Communication with Friends and Family: More than three times a week    Frequency of Social Gatherings with Friends and Family: Once a week    Attends Religious Services: Never    Database administrator or Organizations: No    Attends Engineer, structural: Never    Marital Status: Married    Tobacco Counseling Counseling given: Not Answered   Clinical Intake:  Pre-visit preparation completed: Yes  Pain : No/denies pain     BMI - recorded: 35.02 Nutritional Status: BMI > 30  Obese Nutritional Risks: None Diabetes: No  How often do you need to have someone help you when you read instructions, pamphlets, or other written materials from your doctor or pharmacy?: 1 -  Never  Interpreter Needed?: No  Information entered by :: C.Mitsue Peery LPN   Activities of Daily Living    05/27/2023    7:40 PM  In your present state of health, do you have any difficulty performing the following activities:  Hearing? 0  Vision? 0  Difficulty concentrating or making decisions? 0  Walking or climbing stairs? 0  Dressing or bathing? 0  Doing errands, shopping? 0  Preparing Food and eating ? N  Using the Toilet? N  In the past six months, have you accidently leaked urine? N  Do you have problems with loss of bowel control? N  Managing your Medications? N  Managing your Finances? N    Patient Care Team: Mort Sawyers,  FNP as PCP - General (Family Medicine) Deirdre Evener, MD (Dermatology)  Indicate any recent Medical Services you may have received from other than [facility] providers in the past year (date may be approximate).     Assessment:   This is a routine wellness examination for Demmi.  Hearing/Vision screen Hearing Screening - Comments:: Denies hearing difficulties   Vision Screening - Comments:: Glasses - [facility] - UTD on exams  Dietary issues and exercise activities discussed:     Goals Addressed             This Visit's Progress    Patient Stated       Maintain current health status       Depression Screen    06/10/2023    8:08 AM 02/19/2023    9:13 AM 11/22/2022    8:39 AM 05/24/2022    2:13 PM 05/24/2022   10:30 AM 05/22/2021   10:41 AM 05/22/2021    8:59 AM  PHQ 2/9 Scores  PHQ - 2 Score 0 0 0 0 0 0 0  PHQ- 9 Score   0 0       Fall Risk    05/27/2023    7:40 PM 02/19/2023    9:13 AM 11/22/2022    8:38 AM 05/24/2022    2:15 PM 05/24/2022    9:53 AM  Fall Risk   Falls in the past year? 0 0 0 0 0  Number falls in past yr: 0  0 0 0  Injury with Fall? 0 0 0 0   Risk for fall due to : No Fall Risks   No Fall Risks   Follow up Falls prevention discussed;Falls evaluation completed Falls evaluation completed;Education  provided;Falls prevention discussed Falls prevention discussed;Education provided;Falls evaluation completed Falls prevention discussed;Falls evaluation completed     MEDICARE RISK AT HOME: Medicare Risk at Home Any stairs in or around the home?: Yes If so, are there any without handrails?: No Home free of loose throw rugs in walkways, pet beds, electrical cords, etc?: Yes Adequate lighting in your home to reduce risk of falls?: Yes Life alert?: No Use of a cane, walker or w/c?: No Grab bars in the bathroom?: No Shower chair or bench in shower?: Yes Elevated toilet seat or a handicapped toilet?: No  TIMED UP AND GO:  Was the test performed?  No    Cognitive Function:        05/28/2023    2:40 PM 05/24/2022    2:16 PM  6CIT Screen  What Year? 0 points 0 points  What month? 0 points 0 points  What time? 0 points 0 points  Count back from 20 0 points 0 points  Months in reverse 0 points 0 points  Repeat phrase 0 points 0 points  Total Score 0 points 0 points    Immunizations Immunization History  Administered Date(s) Administered   COVID-19, mRNA, vaccine(Comirnaty)12 years and older 06/19/2022   Influenza,inj,Quad PF,6+ Mos 05/27/2020, 05/22/2021, 05/24/2022   PFIZER(Purple Top)SARS-COV-2 Vaccination 12/11/2019, 01/01/2020, 09/12/2020, 02/15/2021   PNEUMOCOCCAL CONJUGATE-20 05/24/2022   Pfizer Covid-19 Vaccine Bivalent Booster 58yrs & up 06/26/2021    TDAP status: Due, Education has been provided regarding the importance of this vaccine. Advised may receive this vaccine at local pharmacy or Health Dept. Aware to provide a copy of the vaccination record if obtained from local pharmacy or Health Dept. Verbalized acceptance and understanding.  Flu Vaccine status: Due, Education has been provided regarding the importance of this  vaccine. Advised may receive this vaccine at local pharmacy or Health Dept. Aware to provide a copy of the vaccination record if obtained from local  pharmacy or Health Dept. Verbalized acceptance and understanding.  Pneumococcal vaccine status: Up to date  Covid-19 vaccine status: Information provided on how to obtain vaccines.   Qualifies for Shingles Vaccine? Yes   Zostavax completed No   Shingrix Completed?: No.    Education has been provided regarding the importance of this vaccine. Patient has been advised to call insurance company to determine out of pocket expense if they have not yet received this vaccine. Advised may also receive vaccine at local pharmacy or Health Dept. Verbalized acceptance and understanding.  Screening Tests Health Maintenance  Topic Date Due   DTaP/Tdap/Td (1 - Tdap) Never done   Zoster Vaccines- Shingrix (1 of 2) Never done   INFLUENZA VACCINE  04/25/2023   COVID-19 Vaccine (7 - 2023-24 season) 05/26/2023   MAMMOGRAM  04/04/2024   Medicare Annual Wellness (AWV)  05/27/2024   Colonoscopy  01/15/2033   Pneumonia Vaccine 51+ Years old  Completed   DEXA SCAN  Completed   Hepatitis C Screening  Completed   HPV VACCINES  Aged Out    Health Maintenance  Health Maintenance Due  Topic Date Due   DTaP/Tdap/Td (1 - Tdap) Never done   Zoster Vaccines- Shingrix (1 of 2) Never done   INFLUENZA VACCINE  04/25/2023   COVID-19 Vaccine (7 - 2023-24 season) 05/26/2023    Colorectal cancer screening: Type of screening: Colonoscopy. Completed 01/16/23. Repeat every 10 years  Mammogram status: Completed 04/05/23. Repeat every year  Bone Density status: Completed 04/05/23. Results reflect: Bone density results: NORMAL. Repeat every 5 years.  Lung Cancer Screening: (Low Dose CT Chest recommended if Age 29-80 years, 20 pack-year currently smoking OR have quit w/in 15years.) does not qualify.   Lung Cancer Screening Referral:    Additional Screening:  Hepatitis C Screening: does qualify; Completed 09/29/20  Vision Screening: Recommended annual ophthalmology exams for early detection of glaucoma and other  disorders of the eye. Is the patient up to date with their annual eye exam?  Yes  Who is the provider or what is the name of the office in which the patient attends annual eye exams? [facility] If pt is not established with a provider, would they like to be referred to a provider to establish care? Yes .   Dental Screening: Recommended annual dental exams for proper oral hygiene  Diabetic Foot Exam:   Community Resource Referral / Chronic Care Management: CRR required this visit?  No   CCM required this visit?  No     Plan:     I have personally reviewed and noted the following in the patient's chart:   Medical and social history Use of alcohol, tobacco or illicit drugs  Current medications and supplements including opioid prescriptions. Patient is not currently taking opioid prescriptions. Functional ability and status Nutritional status Physical activity Advanced directives List of other physicians Hospitalizations, surgeries, and ER visits in previous 12 months Vitals Screenings to include cognitive, depression, and falls Referrals and appointments  In addition, I have reviewed and discussed with patient certain preventive protocols, quality metrics, and best practice recommendations. A written personalized care plan for preventive services as well as general preventive health recommendations were provided to patient.     Maryan Puls, LPN   9/60/4540   After Visit Summary: (MyChart) Due to this being a telephonic visit, the  after visit summary with patients personalized plan was offered to patient via MyChart   Nurse Notes: none

## 2023-06-10 NOTE — Progress Notes (Signed)
Subjective:   Evelyn Bell is a 67 y.o. female who presents for Medicare Annual (Subsequent) preventive examination.  Visit Complete: Virtual  I connected with  Evelyn Bell on 06/10/23 by a audio enabled telemedicine application and verified that I am speaking with the correct person using two identifiers.  Patient Location: Home  Provider Location: Home Office  I discussed the limitations of evaluation and management by telemedicine. The patient expressed understanding and agreed to proceed.  Patient Medicare AWV questionnaire was completed by the patient on 04/26/23; I have confirmed that all information answered by patient is correct and no changes since this date.  Vital Signs: Because this visit was a virtual/telehealth visit, some criteria may be missing or patient reported. Any vitals not documented were not able to be obtained and vitals that have been documented are patient reported.    Review of Systems      Cardiac Risk Factors include: advanced age (>7men, >72 women);hypertension;sedentary lifestyle;dyslipidemia     Objective:    Today's Vitals   05/28/23 1433  Weight: 204 lb (92.5 kg)   Body mass index is 35.02 kg/m.     05/28/2023    2:38 PM 01/16/2023    8:27 AM 01/02/2023   10:09 AM 05/24/2022    2:14 PM 05/22/2021    8:56 AM  Advanced Directives  Does Patient Have a Medical Advance Directive? No No No No No  Would patient like information on creating a medical advance directive? No - Patient declined   No - Patient declined Yes (MAU/Ambulatory/Procedural Areas - Information given)    Current Medications (verified) Outpatient Encounter Medications as of 05/28/2023  Medication Sig   atorvastatin (LIPITOR) 20 MG tablet Take 1 tablet (20 mg total) by mouth daily.   cholecalciferol (VITAMIN D3) 25 MCG (1000 UNIT) tablet Take 1,000 Units by mouth daily.   losartan (COZAAR) 100 MG tablet Take 1 tablet (100 mg total) by mouth daily.   lubiprostone  (AMITIZA) 24 MCG capsule TAKE 1 CAPSULE (24 MCG TOTAL) BY MOUTH 2 (TWO) TIMES DAILY WITH A MEAL.   No facility-administered encounter medications on file as of 05/28/2023.    Allergies (verified) Patient has no known allergies.   History: Past Medical History:  Diagnosis Date   Blood transfusion without reported diagnosis    Hyperlipidemia    Hypertension    Past Surgical History:  Procedure Laterality Date   ABDOMINAL HYSTERECTOMY  1981   still with half an ovary , not sure which side   BREAST CYST ASPIRATION Right 2018   CESAREAN SECTION     COLONOSCOPY WITH PROPOFOL N/A 01/02/2023   Procedure: COLONOSCOPY WITH PROPOFOL;  Surgeon: Toney Reil, MD;  Location: [facility] ENDOSCOPY;  Service: Gastroenterology;  Laterality: N/A;   COLONOSCOPY WITH PROPOFOL N/A 01/16/2023   Procedure: COLONOSCOPY WITH PROPOFOL;  Surgeon: Toney Reil, MD;  Location: [facility] ENDOSCOPY;  Service: Gastroenterology;  Laterality: N/A;   HEMORROIDECTOMY     LAPAROSCOPIC GASTRIC SLEEVE RESECTION  2015   Family History  Problem Relation Age of Onset   Heart disease Mother    Valvular heart disease Mother        passed away surgical complications   Heart disease Sister    Social History   Socioeconomic History   Marital status: Married    Spouse name: Fayrene Fearing   Number of children: 2   Years of education: high school   Highest education level: Not on file  Occupational History   Not on file  Tobacco Use   Smoking status: Never   Smokeless tobacco: Never  Vaping Use   Vaping status: Never Used  Substance and Sexual Activity   Alcohol use: Yes    Comment: 1-2 glasses daily   Drug use: Never   Sexual activity: Yes    Partners: Male    Birth control/protection: Surgical  Other Topics Concern   Not on file  Social History Narrative   09/15/20   From: [place], moved 2021 to be closer to children   Living: with husband, Fayrene Fearing (1981)   Work: retired from working as Scientist, physiological for [employer]       Family: 2 children - Renae Fickle and Amy - 3 grandchildren      Enjoys: garden, reading      Exercise: not currently, enjoys riding bikes   Diet: pretty healthy      Safety   Seat belts: Yes    Guns: No   Safe in relationships: Yes    Social Determinants of Health   Financial Resource Strain: Low Risk  (05/27/2023)   Overall Financial Resource Strain (CARDIA)    Difficulty of Paying Living Expenses: Not hard at all  Food Insecurity: No Food Insecurity (05/27/2023)   Hunger Vital Sign    Worried About Running Out of Food in the Last Year: Never true    Ran Out of Food in the Last Year: Never true  Transportation Needs: No Transportation Needs (05/27/2023)   PRAPARE - Administrator, Civil Service (Medical): No    Lack of Transportation (Non-Medical): No  Physical Activity: Insufficiently Active (05/27/2023)   Exercise Vital Sign    Days of Exercise per Week: 7 days    Minutes of Exercise per Session: 20 min  Stress: No Stress Concern Present (05/27/2023)   [employer] of Occupational Health - Occupational Stress Questionnaire    Feeling of Stress : Not at all  Social Connections: Moderately Isolated (05/27/2023)   Social Connection and Isolation Panel [NHANES]    Frequency of Communication with Friends and Family: More than three times a week    Frequency of Social Gatherings with Friends and Family: Once a week    Attends Religious Services: Never    Database administrator or Organizations: No    Attends Engineer, structural: Never    Marital Status: Married    Tobacco Counseling Counseling given: Not Answered   Clinical Intake:  Pre-visit preparation completed: Yes  Pain : No/denies pain     BMI - recorded: 35.02 Nutritional Status: BMI > 30  Obese Nutritional Risks: None Diabetes: No  How often do you need to have someone help you when you read instructions, pamphlets, or other written materials from your doctor or pharmacy?: 1 -  Never  Interpreter Needed?: No  Information entered by :: C.Johnmatthew Solorio LPN   Activities of Daily Living    05/27/2023    7:40 PM  In your present state of health, do you have any difficulty performing the following activities:  Hearing? 0  Vision? 0  Difficulty concentrating or making decisions? 0  Walking or climbing stairs? 0  Dressing or bathing? 0  Doing errands, shopping? 0  Preparing Food and eating ? N  Using the Toilet? N  In the past six months, have you accidently leaked urine? N  Do you have problems with loss of bowel control? N  Managing your Medications? N  Managing your Finances? N    Patient Care Team: Mort Sawyers,  FNP as PCP - General (Family Medicine) Deirdre Evener, MD (Dermatology)  Indicate any recent Medical Services you may have received from other than [facility] providers in the past year (date may be approximate).     Assessment:   This is a routine wellness examination for Qiara.  Hearing/Vision screen Hearing Screening - Comments:: Denies hearing difficulties   Vision Screening - Comments:: Glasses - [facility] - UTD on exams  Dietary issues and exercise activities discussed:     Goals Addressed             This Visit's Progress    Patient Stated       Maintain current health status       Depression Screen    02/19/2023    9:13 AM 11/22/2022    8:39 AM 05/24/2022    2:13 PM 05/24/2022   10:30 AM 05/22/2021   10:41 AM 05/22/2021    8:59 AM 09/15/2020    9:59 AM  PHQ 2/9 Scores  PHQ - 2 Score 0 0 0 0 0 0 0  PHQ- 9 Score  0 0        Fall Risk    05/27/2023    7:40 PM 02/19/2023    9:13 AM 11/22/2022    8:38 AM 05/24/2022    2:15 PM 05/24/2022    9:53 AM  Fall Risk   Falls in the past year? 0 0 0 0 0  Number falls in past yr: 0  0 0 0  Injury with Fall? 0 0 0 0   Risk for fall due to : No Fall Risks   No Fall Risks   Follow up Falls prevention discussed;Falls evaluation completed Falls evaluation completed;Education  provided;Falls prevention discussed Falls prevention discussed;Education provided;Falls evaluation completed Falls prevention discussed;Falls evaluation completed     MEDICARE RISK AT HOME: Medicare Risk at Home Any stairs in or around the home?: Yes If so, are there any without handrails?: No Home free of loose throw rugs in walkways, pet beds, electrical cords, etc?: Yes Adequate lighting in your home to reduce risk of falls?: Yes Life alert?: No Use of a cane, walker or w/c?: No Grab bars in the bathroom?: No Shower chair or bench in shower?: Yes Elevated toilet seat or a handicapped toilet?: No  TIMED UP AND GO:  Was the test performed?  No    Cognitive Function:        05/28/2023    2:40 PM 05/24/2022    2:16 PM  6CIT Screen  What Year? 0 points 0 points  What month? 0 points 0 points  What time? 0 points 0 points  Count back from 20 0 points 0 points  Months in reverse 0 points 0 points  Repeat phrase 0 points 0 points  Total Score 0 points 0 points    Immunizations Immunization History  Administered Date(s) Administered   COVID-19, mRNA, vaccine(Comirnaty)12 years and older 06/19/2022   Influenza,inj,Quad PF,6+ Mos 05/27/2020, 05/22/2021, 05/24/2022   PFIZER(Purple Top)SARS-COV-2 Vaccination 12/11/2019, 01/01/2020, 09/12/2020, 02/15/2021   PNEUMOCOCCAL CONJUGATE-20 05/24/2022   Pfizer Covid-19 Vaccine Bivalent Booster 8yrs & up 06/26/2021    TDAP status: Due, Education has been provided regarding the importance of this vaccine. Advised may receive this vaccine at local pharmacy or Health Dept. Aware to provide a copy of the vaccination record if obtained from local pharmacy or Health Dept. Verbalized acceptance and understanding.  Flu Vaccine status: Due, Education has been provided regarding the importance of this  vaccine. Advised may receive this vaccine at local pharmacy or Health Dept. Aware to provide a copy of the vaccination record if obtained from local  pharmacy or Health Dept. Verbalized acceptance and understanding.  Pneumococcal vaccine status: Up to date  Covid-19 vaccine status: Information provided on how to obtain vaccines.   Qualifies for Shingles Vaccine? Yes   Zostavax completed No   Shingrix Completed?: No.    Education has been provided regarding the importance of this vaccine. Patient has been advised to call insurance company to determine out of pocket expense if they have not yet received this vaccine. Advised may also receive vaccine at local pharmacy or Health Dept. Verbalized acceptance and understanding.  Screening Tests Health Maintenance  Topic Date Due   DTaP/Tdap/Td (1 - Tdap) Never done   Zoster Vaccines- Shingrix (1 of 2) Never done   INFLUENZA VACCINE  04/25/2023   COVID-19 Vaccine (7 - 2023-24 season) 05/26/2023   MAMMOGRAM  04/04/2024   Medicare Annual Wellness (AWV)  05/27/2024   Colonoscopy  01/15/2033   Pneumonia Vaccine 18+ Years old  Completed   DEXA SCAN  Completed   Hepatitis C Screening  Completed   HPV VACCINES  Aged Out    Health Maintenance  Health Maintenance Due  Topic Date Due   DTaP/Tdap/Td (1 - Tdap) Never done   Zoster Vaccines- Shingrix (1 of 2) Never done   INFLUENZA VACCINE  04/25/2023   COVID-19 Vaccine (7 - 2023-24 season) 05/26/2023    Colorectal cancer screening: Type of screening: Colonoscopy. Completed 01/16/23. Repeat every 10 years  Mammogram status: Completed 04/05/23. Repeat every year  Bone Density status: Completed 04/05/23. Results reflect: Bone density results: NORMAL. Repeat every 5 years.  Lung Cancer Screening: (Low Dose CT Chest recommended if Age 65-80 years, 20 pack-year currently smoking OR have quit w/in 15years.) does not qualify.   Lung Cancer Screening Referral:    Additional Screening:  Hepatitis C Screening: does qualify; Completed 09/29/20  Vision Screening: Recommended annual ophthalmology exams for early detection of glaucoma and other  disorders of the eye. Is the patient up to date with their annual eye exam?  Yes  Who is the provider or what is the name of the office in which the patient attends annual eye exams? [facility] If pt is not established with a provider, would they like to be referred to a provider to establish care? Yes .   Dental Screening: Recommended annual dental exams for proper oral hygiene  Diabetic Foot Exam:   Community Resource Referral / Chronic Care Management: CRR required this visit?  No   CCM required this visit?  No     Plan:     I have personally reviewed and noted the following in the patient's chart:   Medical and social history Use of alcohol, tobacco or illicit drugs  Current medications and supplements including opioid prescriptions. Patient is not currently taking opioid prescriptions. Functional ability and status Nutritional status Physical activity Advanced directives List of other physicians Hospitalizations, surgeries, and ER visits in previous 12 months Vitals Screenings to include cognitive, depression, and falls Referrals and appointments  In addition, I have reviewed and discussed with patient certain preventive protocols, quality metrics, and best practice recommendations. A written personalized care plan for preventive services as well as general preventive health recommendations were provided to patient.     Maryan Puls, LPN   1/61/0960   After Visit Summary: (MyChart) Due to this being a telephonic visit, the  after visit summary with patients personalized plan was offered to patient via MyChart   Nurse Notes: none

## 2023-08-19 ENCOUNTER — Ambulatory Visit (INDEPENDENT_AMBULATORY_CARE_PROVIDER_SITE_OTHER): Payer: Medicare HMO | Admitting: Family

## 2023-08-19 ENCOUNTER — Other Ambulatory Visit: Payer: Self-pay | Admitting: Family

## 2023-08-19 ENCOUNTER — Encounter: Payer: Self-pay | Admitting: Family

## 2023-08-19 VITALS — BP 136/88 | HR 100 | Temp 98.2°F | Ht 64.0 in | Wt 211.6 lb

## 2023-08-19 DIAGNOSIS — Z79899 Other long term (current) drug therapy: Secondary | ICD-10-CM | POA: Diagnosis not present

## 2023-08-19 DIAGNOSIS — K644 Residual hemorrhoidal skin tags: Secondary | ICD-10-CM

## 2023-08-19 DIAGNOSIS — E559 Vitamin D deficiency, unspecified: Secondary | ICD-10-CM

## 2023-08-19 DIAGNOSIS — Z1211 Encounter for screening for malignant neoplasm of colon: Secondary | ICD-10-CM

## 2023-08-19 DIAGNOSIS — I10 Essential (primary) hypertension: Secondary | ICD-10-CM

## 2023-08-19 DIAGNOSIS — E782 Mixed hyperlipidemia: Secondary | ICD-10-CM

## 2023-08-19 DIAGNOSIS — R195 Other fecal abnormalities: Secondary | ICD-10-CM | POA: Diagnosis not present

## 2023-08-19 DIAGNOSIS — E875 Hyperkalemia: Secondary | ICD-10-CM

## 2023-08-19 DIAGNOSIS — R12 Heartburn: Secondary | ICD-10-CM | POA: Diagnosis not present

## 2023-08-19 LAB — LIPID PANEL
Cholesterol: 176 mg/dL (ref 0–200)
HDL: 44.3 mg/dL (ref >39.00)
LDL Cholesterol: 98 mg/dL (ref 0–99)
NonHDL: 131.51
Total CHOL/HDL Ratio: 4
Triglycerides: 168 mg/dL — ABNORMAL HIGH (ref 0.0–149.0)
VLDL: 33.6 mg/dL (ref 0.0–40.0)

## 2023-08-19 LAB — COMPREHENSIVE METABOLIC PANEL
ALT: 28 U/L (ref 0–35)
AST: 25 U/L (ref 0–37)
Albumin: 4.1 g/dL (ref 3.5–5.2)
Alkaline Phosphatase: 88 U/L (ref 39–117)
BUN: 12 mg/dL (ref 6–23)
CO2: 29 meq/L (ref 19–32)
Calcium: 9.9 mg/dL (ref 8.4–10.5)
Chloride: 102 meq/L (ref 96–112)
Creatinine, Ser: 0.76 mg/dL (ref 0.40–1.20)
GFR: 80.8 mL/min (ref >60.00)
Glucose, Bld: 90 mg/dL (ref 70–99)
Potassium: 5.2 meq/L — ABNORMAL HIGH (ref 3.5–5.1)
Sodium: 139 meq/L (ref 135–145)
Total Bilirubin: 0.9 mg/dL (ref 0.2–1.2)
Total Protein: 7.1 g/dL (ref 6.0–8.3)

## 2023-08-19 LAB — CBC
HCT: 42.7 % (ref 36.0–46.0)
Hemoglobin: 14.4 g/dL (ref 12.0–15.0)
MCHC: 33.6 g/dL (ref 30.0–36.0)
MCV: 96.8 fL (ref 78.0–100.0)
Platelets: 249 10*3/uL (ref 150.0–400.0)
RBC: 4.41 Mil/uL (ref 3.87–5.11)
RDW: 12.9 % (ref 11.5–15.5)
WBC: 6.7 10*3/uL (ref 4.0–10.5)

## 2023-08-19 LAB — VITAMIN D 25 HYDROXY (VIT D DEFICIENCY, FRACTURES): VITD: 35.68 ng/mL (ref 30.00–100.00)

## 2023-08-19 MED ORDER — PANTOPRAZOLE SODIUM 20 MG PO TBEC: 20.0000 mg | DELAYED_RELEASE_TABLET | Freq: Every day | ORAL | 0 refills | Status: DC

## 2023-08-19 MED ORDER — ATORVASTATIN CALCIUM 40 MG PO TABS: 40.0000 mg | ORAL_TABLET | Freq: Every day | ORAL | 3 refills | Status: DC

## 2023-08-19 NOTE — Assessment & Plan Note (Signed)
Continue vitamin D3 2000 IU once daily  Will check vitamin D level today pending results.

## 2023-08-19 NOTE — Assessment & Plan Note (Signed)
Continue losartan 100 mg once daily  stable

## 2023-08-19 NOTE — Patient Instructions (Signed)
------------------------------------    Please start trial of omeprazole as prescribed. Take this medication for two weeks, administer 30 minutes prior to breakfast each am. Try to decrease and or avoid spicy foods, fried fatty foods, and also caffeine and chocolate as these can increase heartburn symptoms.   ------------------------------------

## 2023-08-19 NOTE — Progress Notes (Signed)
Established Patient Office Visit  Subjective:      CC:  Chief Complaint  Patient presents with   Medical Management of Chronic Issues    HPI: Evelyn Bell is a 67 y.o. female presenting on 08/19/2023 for Medical Management of Chronic Issues . HTN: losartan 100 mg once daily, today 136/88. Average at home is around 120/80. Denies cp palp and or sob.   HLD: doing well and tolerating atorvastatin 20 mg nightly. Trying to really work on low cholesterol diet.   Vitamin D def: taking daily 1000 I/U D3 once daily.  Positive IFOB: referred to GI back in feb 2024. Colonoscopy 01/02/23, was advised to repeat due to incomplete prep however she did not go back. High fiber diet, no longer having issues with hemorrhoids, no longer with constipation has daily bowel movements. Doesn't have urge to poop, however she does stick to the clock and she can go and have a regular bowel movement but does not have the urge. She does have have issues with continence.   Recently started with heartburn more than usual, has been eating a bit worse than usual. Does need pepcid and tums at times.      Social history:  Relevant past medical, surgical, family and social history reviewed and updated as indicated. Interim medical history since our last visit reviewed.  Allergies and medications reviewed and updated.  DATA REVIEWED: CHART IN EPIC     ROS: Negative unless specifically indicated above in HPI.    Current Outpatient Medications:    atorvastatin (LIPITOR) 20 MG tablet, Take 1 tablet (20 mg total) by mouth daily., Disp: 90 tablet, Rfl: 3   cholecalciferol (VITAMIN D3) 25 MCG (1000 UNIT) tablet, Take 1,000 Units by mouth daily., Disp: , Rfl:    losartan (COZAAR) 100 MG tablet, Take 1 tablet (100 mg total) by mouth daily., Disp: 90 tablet, Rfl: 3   pantoprazole (PROTONIX) 20 MG tablet, Take 1 tablet (20 mg total) by mouth daily., Disp: 30 tablet, Rfl: 0      Objective:    BP 136/88  (BP Location: Left Arm, Patient Position: Sitting, Cuff Size: Large)   Pulse 100   Temp 98.2 F (36.8 C) (Temporal)   Ht 5\' 4"  (1.626 m)   Wt 211 lb 9.6 oz (96 kg)   SpO2 99%   BMI 36.32 kg/m   Wt Readings from Last 3 Encounters:  08/19/23 211 lb 9.6 oz (96 kg)  05/28/23 204 lb (92.5 kg)  02/19/23 211 lb (95.7 kg)    Physical Exam Constitutional:      General: She is not in acute distress.    Appearance: Normal appearance. She is normal weight. She is not ill-appearing, toxic-appearing or diaphoretic.  HENT:     Head: Normocephalic.  Cardiovascular:     Rate and Rhythm: Normal rate.  Pulmonary:     Effort: Pulmonary effort is normal.  Abdominal:     General: Abdomen is flat. There is no distension.     Tenderness: There is no abdominal tenderness. There is no guarding.     Hernia: No hernia is present.  Musculoskeletal:        General: Normal range of motion.     Right lower leg: No edema.     Left lower leg: No edema.  Neurological:     General: No focal deficit present.     Mental Status: She is alert and oriented to person, place, and time. Mental status is at baseline.  Psychiatric:  Mood and Affect: Mood normal.        Behavior: Behavior normal.        Thought Content: Thought content normal.        Judgment: Judgment normal.           Assessment & Plan:  Mixed hyperlipidemia Assessment & Plan: Ordered lipid panel, pending results. Work on low cholesterol diet and exercise as tolerated Continue atorvastatin 20 mg   Orders: -     Lipid panel  On statin therapy -     Comprehensive metabolic panel  Positive fecal occult blood test Assessment & Plan: Pt had colonoscopy x 2 with inadequate prep Also with lack of fecal urge Did state ultimately needs to follow up with GI however we will order cologuard as pt is hesitant to f/u . Did tell pt that if positive needs to go back asap. Did also recommend she still make f/u as lack of urge  Orders: -      CBC -     Cologuard  Vitamin D deficiency Assessment & Plan: Continue vitamin D3 2000 IU once daily  Will check vitamin D level today pending results.  Orders: -     VITAMIN D 25 Hydroxy (Vit-D Deficiency, Fractures)  Screening for colon cancer -     Cologuard  Heartburn Assessment & Plan: Pantoprazole rx x 30 days Try to decrease and or avoid spicy foods, fried fatty foods, and also caffeine and chocolate as these can increase heartburn symptoms.    Orders: -     Pantoprazole Sodium; Take 1 tablet (20 mg total) by mouth daily.  Dispense: 30 tablet; Refill: 0  External hemorrhoids without complication Assessment & Plan: Asymptomatic  Improving with fiber per pt   Primary hypertension Assessment & Plan: Continue losartan 100 mg once daily  stable      Return in about 6 months (around 02/16/2024) for f/u CPE.  Mort Sawyers, MSN, APRN, FNP-C [facility]

## 2023-08-19 NOTE — Assessment & Plan Note (Signed)
Pantoprazole rx x 30 days Try to decrease and or avoid spicy foods, fried fatty foods, and also caffeine and chocolate as these can increase heartburn symptoms.

## 2023-08-19 NOTE — Assessment & Plan Note (Signed)
Asymptomatic  Improving with fiber per pt

## 2023-08-19 NOTE — Assessment & Plan Note (Addendum)
Pt had colonoscopy x 2 with inadequate prep Also with lack of fecal urge Did state ultimately needs to follow up with GI however we will order cologuard as pt is hesitant to f/u . Did tell pt that if positive needs to go back asap. Did also recommend she still make f/u as lack of urge

## 2023-08-19 NOTE — Assessment & Plan Note (Signed)
Ordered lipid panel, pending results. Work on low cholesterol diet and exercise as tolerated. Continue atorvastatin 20 mg

## 2023-09-03 DIAGNOSIS — H524 Presbyopia: Secondary | ICD-10-CM | POA: Diagnosis not present

## 2023-09-10 ENCOUNTER — Other Ambulatory Visit: Payer: Self-pay | Admitting: Family

## 2023-09-10 DIAGNOSIS — R12 Heartburn: Secondary | ICD-10-CM

## 2023-09-23 DIAGNOSIS — M9903 Segmental and somatic dysfunction of lumbar region: Secondary | ICD-10-CM | POA: Diagnosis not present

## 2023-09-23 DIAGNOSIS — M9905 Segmental and somatic dysfunction of pelvic region: Secondary | ICD-10-CM | POA: Diagnosis not present

## 2023-09-23 DIAGNOSIS — M4317 Spondylolisthesis, lumbosacral region: Secondary | ICD-10-CM | POA: Diagnosis not present

## 2023-09-23 DIAGNOSIS — M4306 Spondylolysis, lumbar region: Secondary | ICD-10-CM | POA: Diagnosis not present

## 2023-09-24 DIAGNOSIS — M4306 Spondylolysis, lumbar region: Secondary | ICD-10-CM | POA: Diagnosis not present

## 2023-09-24 DIAGNOSIS — M9905 Segmental and somatic dysfunction of pelvic region: Secondary | ICD-10-CM | POA: Diagnosis not present

## 2023-09-24 DIAGNOSIS — M9903 Segmental and somatic dysfunction of lumbar region: Secondary | ICD-10-CM | POA: Diagnosis not present

## 2023-09-24 DIAGNOSIS — M4317 Spondylolisthesis, lumbosacral region: Secondary | ICD-10-CM | POA: Diagnosis not present

## 2023-09-26 DIAGNOSIS — M4306 Spondylolysis, lumbar region: Secondary | ICD-10-CM | POA: Diagnosis not present

## 2023-09-26 DIAGNOSIS — M9903 Segmental and somatic dysfunction of lumbar region: Secondary | ICD-10-CM | POA: Diagnosis not present

## 2023-09-26 DIAGNOSIS — M9905 Segmental and somatic dysfunction of pelvic region: Secondary | ICD-10-CM | POA: Diagnosis not present

## 2023-09-26 DIAGNOSIS — M4317 Spondylolisthesis, lumbosacral region: Secondary | ICD-10-CM | POA: Diagnosis not present

## 2023-09-30 DIAGNOSIS — M4306 Spondylolysis, lumbar region: Secondary | ICD-10-CM | POA: Diagnosis not present

## 2023-09-30 DIAGNOSIS — M9905 Segmental and somatic dysfunction of pelvic region: Secondary | ICD-10-CM | POA: Diagnosis not present

## 2023-09-30 DIAGNOSIS — M4317 Spondylolisthesis, lumbosacral region: Secondary | ICD-10-CM | POA: Diagnosis not present

## 2023-09-30 DIAGNOSIS — M9903 Segmental and somatic dysfunction of lumbar region: Secondary | ICD-10-CM | POA: Diagnosis not present

## 2023-10-02 DIAGNOSIS — M4317 Spondylolisthesis, lumbosacral region: Secondary | ICD-10-CM | POA: Diagnosis not present

## 2023-10-02 DIAGNOSIS — M9905 Segmental and somatic dysfunction of pelvic region: Secondary | ICD-10-CM | POA: Diagnosis not present

## 2023-10-02 DIAGNOSIS — M9903 Segmental and somatic dysfunction of lumbar region: Secondary | ICD-10-CM | POA: Diagnosis not present

## 2023-10-02 DIAGNOSIS — M4306 Spondylolysis, lumbar region: Secondary | ICD-10-CM | POA: Diagnosis not present

## 2023-10-03 DIAGNOSIS — M9905 Segmental and somatic dysfunction of pelvic region: Secondary | ICD-10-CM | POA: Diagnosis not present

## 2023-10-03 DIAGNOSIS — M4306 Spondylolysis, lumbar region: Secondary | ICD-10-CM | POA: Diagnosis not present

## 2023-10-03 DIAGNOSIS — M9903 Segmental and somatic dysfunction of lumbar region: Secondary | ICD-10-CM | POA: Diagnosis not present

## 2023-10-03 DIAGNOSIS — M4317 Spondylolisthesis, lumbosacral region: Secondary | ICD-10-CM | POA: Diagnosis not present

## 2023-10-07 DIAGNOSIS — M4306 Spondylolysis, lumbar region: Secondary | ICD-10-CM | POA: Diagnosis not present

## 2023-10-07 DIAGNOSIS — M9903 Segmental and somatic dysfunction of lumbar region: Secondary | ICD-10-CM | POA: Diagnosis not present

## 2023-10-07 DIAGNOSIS — M9905 Segmental and somatic dysfunction of pelvic region: Secondary | ICD-10-CM | POA: Diagnosis not present

## 2023-10-07 DIAGNOSIS — M4317 Spondylolisthesis, lumbosacral region: Secondary | ICD-10-CM | POA: Diagnosis not present

## 2023-10-09 DIAGNOSIS — M4317 Spondylolisthesis, lumbosacral region: Secondary | ICD-10-CM | POA: Diagnosis not present

## 2023-10-09 DIAGNOSIS — Z1211 Encounter for screening for malignant neoplasm of colon: Secondary | ICD-10-CM | POA: Diagnosis not present

## 2023-10-09 DIAGNOSIS — M4306 Spondylolysis, lumbar region: Secondary | ICD-10-CM | POA: Diagnosis not present

## 2023-10-09 DIAGNOSIS — M9905 Segmental and somatic dysfunction of pelvic region: Secondary | ICD-10-CM | POA: Diagnosis not present

## 2023-10-09 DIAGNOSIS — M9903 Segmental and somatic dysfunction of lumbar region: Secondary | ICD-10-CM | POA: Diagnosis not present

## 2023-10-10 DIAGNOSIS — M9905 Segmental and somatic dysfunction of pelvic region: Secondary | ICD-10-CM | POA: Diagnosis not present

## 2023-10-10 DIAGNOSIS — M4317 Spondylolisthesis, lumbosacral region: Secondary | ICD-10-CM | POA: Diagnosis not present

## 2023-10-10 DIAGNOSIS — M4306 Spondylolysis, lumbar region: Secondary | ICD-10-CM | POA: Diagnosis not present

## 2023-10-10 DIAGNOSIS — M9903 Segmental and somatic dysfunction of lumbar region: Secondary | ICD-10-CM | POA: Diagnosis not present

## 2023-10-15 DIAGNOSIS — M9905 Segmental and somatic dysfunction of pelvic region: Secondary | ICD-10-CM | POA: Diagnosis not present

## 2023-10-15 DIAGNOSIS — M4317 Spondylolisthesis, lumbosacral region: Secondary | ICD-10-CM | POA: Diagnosis not present

## 2023-10-15 DIAGNOSIS — M4306 Spondylolysis, lumbar region: Secondary | ICD-10-CM | POA: Diagnosis not present

## 2023-10-15 DIAGNOSIS — M9903 Segmental and somatic dysfunction of lumbar region: Secondary | ICD-10-CM | POA: Diagnosis not present

## 2023-10-15 LAB — COLOGUARD: COLOGUARD: POSITIVE — AB

## 2023-10-16 ENCOUNTER — Encounter: Payer: Self-pay | Admitting: Family

## 2023-10-16 DIAGNOSIS — R195 Other fecal abnormalities: Secondary | ICD-10-CM | POA: Insufficient documentation

## 2023-10-17 DIAGNOSIS — M4306 Spondylolysis, lumbar region: Secondary | ICD-10-CM | POA: Diagnosis not present

## 2023-10-17 DIAGNOSIS — M9905 Segmental and somatic dysfunction of pelvic region: Secondary | ICD-10-CM | POA: Diagnosis not present

## 2023-10-17 DIAGNOSIS — M9903 Segmental and somatic dysfunction of lumbar region: Secondary | ICD-10-CM | POA: Diagnosis not present

## 2023-10-17 DIAGNOSIS — M4317 Spondylolisthesis, lumbosacral region: Secondary | ICD-10-CM | POA: Diagnosis not present

## 2023-10-22 DIAGNOSIS — M4317 Spondylolisthesis, lumbosacral region: Secondary | ICD-10-CM | POA: Diagnosis not present

## 2023-10-22 DIAGNOSIS — M9905 Segmental and somatic dysfunction of pelvic region: Secondary | ICD-10-CM | POA: Diagnosis not present

## 2023-10-22 DIAGNOSIS — M9903 Segmental and somatic dysfunction of lumbar region: Secondary | ICD-10-CM | POA: Diagnosis not present

## 2023-10-22 DIAGNOSIS — M4306 Spondylolysis, lumbar region: Secondary | ICD-10-CM | POA: Diagnosis not present

## 2023-10-24 DIAGNOSIS — M9903 Segmental and somatic dysfunction of lumbar region: Secondary | ICD-10-CM | POA: Diagnosis not present

## 2023-10-24 DIAGNOSIS — M4306 Spondylolysis, lumbar region: Secondary | ICD-10-CM | POA: Diagnosis not present

## 2023-10-24 DIAGNOSIS — M9905 Segmental and somatic dysfunction of pelvic region: Secondary | ICD-10-CM | POA: Diagnosis not present

## 2023-10-24 DIAGNOSIS — M4317 Spondylolisthesis, lumbosacral region: Secondary | ICD-10-CM | POA: Diagnosis not present

## 2023-10-29 DIAGNOSIS — M9905 Segmental and somatic dysfunction of pelvic region: Secondary | ICD-10-CM | POA: Diagnosis not present

## 2023-10-29 DIAGNOSIS — M4306 Spondylolysis, lumbar region: Secondary | ICD-10-CM | POA: Diagnosis not present

## 2023-10-29 DIAGNOSIS — M4317 Spondylolisthesis, lumbosacral region: Secondary | ICD-10-CM | POA: Diagnosis not present

## 2023-10-29 DIAGNOSIS — M9903 Segmental and somatic dysfunction of lumbar region: Secondary | ICD-10-CM | POA: Diagnosis not present

## 2023-10-31 DIAGNOSIS — M4317 Spondylolisthesis, lumbosacral region: Secondary | ICD-10-CM | POA: Diagnosis not present

## 2023-10-31 DIAGNOSIS — M4306 Spondylolysis, lumbar region: Secondary | ICD-10-CM | POA: Diagnosis not present

## 2023-10-31 DIAGNOSIS — M9903 Segmental and somatic dysfunction of lumbar region: Secondary | ICD-10-CM | POA: Diagnosis not present

## 2023-10-31 DIAGNOSIS — M9905 Segmental and somatic dysfunction of pelvic region: Secondary | ICD-10-CM | POA: Diagnosis not present

## 2023-11-06 DIAGNOSIS — M9905 Segmental and somatic dysfunction of pelvic region: Secondary | ICD-10-CM | POA: Diagnosis not present

## 2023-11-06 DIAGNOSIS — M9903 Segmental and somatic dysfunction of lumbar region: Secondary | ICD-10-CM | POA: Diagnosis not present

## 2023-11-06 DIAGNOSIS — M4317 Spondylolisthesis, lumbosacral region: Secondary | ICD-10-CM | POA: Diagnosis not present

## 2023-11-06 DIAGNOSIS — M4306 Spondylolysis, lumbar region: Secondary | ICD-10-CM | POA: Diagnosis not present

## 2023-11-13 ENCOUNTER — Other Ambulatory Visit: Payer: Self-pay | Admitting: Family

## 2023-11-13 DIAGNOSIS — I10 Essential (primary) hypertension: Secondary | ICD-10-CM

## 2023-11-20 DIAGNOSIS — M4306 Spondylolysis, lumbar region: Secondary | ICD-10-CM | POA: Diagnosis not present

## 2023-11-20 DIAGNOSIS — M9905 Segmental and somatic dysfunction of pelvic region: Secondary | ICD-10-CM | POA: Diagnosis not present

## 2023-11-20 DIAGNOSIS — M4317 Spondylolisthesis, lumbosacral region: Secondary | ICD-10-CM | POA: Diagnosis not present

## 2023-11-20 DIAGNOSIS — M9903 Segmental and somatic dysfunction of lumbar region: Secondary | ICD-10-CM | POA: Diagnosis not present

## 2023-12-04 DIAGNOSIS — M9905 Segmental and somatic dysfunction of pelvic region: Secondary | ICD-10-CM | POA: Diagnosis not present

## 2023-12-04 DIAGNOSIS — M4306 Spondylolysis, lumbar region: Secondary | ICD-10-CM | POA: Diagnosis not present

## 2023-12-04 DIAGNOSIS — M9903 Segmental and somatic dysfunction of lumbar region: Secondary | ICD-10-CM | POA: Diagnosis not present

## 2023-12-04 DIAGNOSIS — M4317 Spondylolisthesis, lumbosacral region: Secondary | ICD-10-CM | POA: Diagnosis not present

## 2024-01-01 DIAGNOSIS — M9903 Segmental and somatic dysfunction of lumbar region: Secondary | ICD-10-CM | POA: Diagnosis not present

## 2024-01-01 DIAGNOSIS — M9905 Segmental and somatic dysfunction of pelvic region: Secondary | ICD-10-CM | POA: Diagnosis not present

## 2024-01-01 DIAGNOSIS — M4306 Spondylolysis, lumbar region: Secondary | ICD-10-CM | POA: Diagnosis not present

## 2024-01-01 DIAGNOSIS — M4317 Spondylolisthesis, lumbosacral region: Secondary | ICD-10-CM | POA: Diagnosis not present

## 2024-02-12 DIAGNOSIS — M4306 Spondylolysis, lumbar region: Secondary | ICD-10-CM | POA: Diagnosis not present

## 2024-02-12 DIAGNOSIS — M4317 Spondylolisthesis, lumbosacral region: Secondary | ICD-10-CM | POA: Diagnosis not present

## 2024-02-12 DIAGNOSIS — M9905 Segmental and somatic dysfunction of pelvic region: Secondary | ICD-10-CM | POA: Diagnosis not present

## 2024-02-12 DIAGNOSIS — M9903 Segmental and somatic dysfunction of lumbar region: Secondary | ICD-10-CM | POA: Diagnosis not present

## 2024-02-18 ENCOUNTER — Ambulatory Visit (INDEPENDENT_AMBULATORY_CARE_PROVIDER_SITE_OTHER): Payer: Medicare HMO | Admitting: Family

## 2024-02-18 ENCOUNTER — Encounter: Payer: Self-pay | Admitting: Family

## 2024-02-18 ENCOUNTER — Ambulatory Visit: Payer: Self-pay | Admitting: Family

## 2024-02-18 VITALS — BP 146/105 | HR 102 | Temp 98.0°F | Ht 64.0 in | Wt 213.0 lb

## 2024-02-18 DIAGNOSIS — I10 Essential (primary) hypertension: Secondary | ICD-10-CM | POA: Insufficient documentation

## 2024-02-18 DIAGNOSIS — E559 Vitamin D deficiency, unspecified: Secondary | ICD-10-CM | POA: Diagnosis not present

## 2024-02-18 DIAGNOSIS — K0510 Chronic gingivitis, plaque induced: Secondary | ICD-10-CM | POA: Insufficient documentation

## 2024-02-18 DIAGNOSIS — Z0000 Encounter for general adult medical examination without abnormal findings: Secondary | ICD-10-CM | POA: Insufficient documentation

## 2024-02-18 DIAGNOSIS — R009 Unspecified abnormalities of heart beat: Secondary | ICD-10-CM

## 2024-02-18 DIAGNOSIS — Z1231 Encounter for screening mammogram for malignant neoplasm of breast: Secondary | ICD-10-CM

## 2024-02-18 DIAGNOSIS — R195 Other fecal abnormalities: Secondary | ICD-10-CM | POA: Diagnosis not present

## 2024-02-18 DIAGNOSIS — E875 Hyperkalemia: Secondary | ICD-10-CM

## 2024-02-18 LAB — LIPID PANEL
Cholesterol: 181 mg/dL (ref 0–200)
HDL: 51 mg/dL (ref >39.00)
LDL Cholesterol: 93 mg/dL (ref 0–99)
NonHDL: 130.45
Total CHOL/HDL Ratio: 4
Triglycerides: 185 mg/dL — ABNORMAL HIGH (ref 0.0–149.0)
VLDL: 37 mg/dL (ref 0.0–40.0)

## 2024-02-18 LAB — CBC
HCT: 43.8 % (ref 36.0–46.0)
Hemoglobin: 14.7 g/dL (ref 12.0–15.0)
MCHC: 33.6 g/dL (ref 30.0–36.0)
MCV: 95.2 fl (ref 78.0–100.0)
Platelets: 214 10*3/uL (ref 150.0–400.0)
RBC: 4.6 Mil/uL (ref 3.87–5.11)
RDW: 14.4 % (ref 11.5–15.5)
WBC: 6.1 10*3/uL (ref 4.0–10.5)

## 2024-02-18 LAB — BASIC METABOLIC PANEL WITH GFR
BUN: 14 mg/dL (ref 6–23)
CO2: 27 meq/L (ref 19–32)
Calcium: 9.5 mg/dL (ref 8.4–10.5)
Chloride: 101 meq/L (ref 96–112)
Creatinine, Ser: 0.67 mg/dL (ref 0.40–1.20)
GFR: 89.81 mL/min (ref >60.00)
Glucose, Bld: 93 mg/dL (ref 70–99)
Potassium: 4.5 meq/L (ref 3.5–5.1)
Sodium: 137 meq/L (ref 135–145)

## 2024-02-18 LAB — MICROALBUMIN / CREATININE URINE RATIO
Creatinine,U: 113.8 mg/dL
Microalb Creat Ratio: 11.6 mg/g (ref 0.0–30.0)
Microalb, Ur: 1.3 mg/dL (ref 0.0–1.9)

## 2024-02-18 LAB — VITAMIN D 25 HYDROXY (VIT D DEFICIENCY, FRACTURES): VITD: 44.4 ng/mL (ref 30.00–100.00)

## 2024-02-18 NOTE — Assessment & Plan Note (Signed)
Ordered vitamin d pending results.  Continue supplementation

## 2024-02-18 NOTE — Progress Notes (Signed)
 Subjective:  Patient ID: Evelyn Bell, female    DOB: [birth year]  Age: 68 y.o. MRN: [id number]  Patient Care Team: Felicita Horns, FNP as PCP - General (Family Medicine) Elta Halter, MD (Dermatology)   CC:  Chief Complaint  Patient presents with   Annual Exam    HPI Evelyn Bell is a 68 y.o. female who presents today for an annual physical exam. She reports consuming a general diet. The patient does not participate in regular exercise at present. She generally feels well. She reports sleeping well. She does not have additional problems to discuss today.   Vision:Within last year Dental:Receives regular dental care  Lung Cancer Screening with low-dose Chest CT: smoked as a teen but only a few times.   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 however positive cologuard 10/09/23, she is aware she has to go back.denies any melena and or constipation or abdominal pain.  Bone density scan: 04/05/23, normal.   Pt is without acute concerns.   Hyperkalemia, suspected not true elevation, however pt did not repeat since November. States she does not take any supplementation for this. No palpitations or chest pain.Aaron Aas   HTN: on losartan  100 mg once daily, has not been checking at home but states she will start checking.   Advanced Directives Patient does not have advanced directives   DEPRESSION SCREENING    02/18/2024    9:07 AM 08/19/2023    9:10 AM 06/10/2023    8:08 AM 02/19/2023    9:13 AM 11/22/2022    8:39 AM 05/24/2022    2:13 PM 05/24/2022   10:30 AM  PHQ 2/9 Scores  PHQ - 2 Score 0 0 0 0 0 0 0  PHQ- 9 Score 0 0   0 0      ROS: Negative unless specifically indicated above in HPI.    Current Outpatient Medications:    atorvastatin  (LIPITOR) 40 MG tablet, Take 1 tablet (40 mg total) by mouth daily., Disp: 90 tablet, Rfl: 3   cholecalciferol (VITAMIN D3) 25 MCG (1000  UNIT) tablet, Take 1,000 Units by mouth daily., Disp: , Rfl:    losartan  (COZAAR ) 100 MG tablet, TAKE 1 TABLET BY MOUTH EVERY DAY, Disp: 90 tablet, Rfl: 3    Objective:    BP (!) 146/105   Pulse (!) 102   Temp 98 F (36.7 C) (Temporal)   Ht 5\' 4"  (1.626 m)   Wt 213 lb (96.6 kg)   SpO2 100%   BMI 36.56 kg/m   BP Readings from Last 3 Encounters:  02/18/24 (!) 146/105  08/19/23 136/88  02/19/23 136/86      Physical Exam Constitutional:      General: She is not in acute distress.    Appearance: Normal appearance. She is obese. She is not ill-appearing.  HENT:     Head: Normocephalic.     Right Ear: Tympanic membrane normal.     Left Ear: Tympanic membrane normal.     Nose: Nose normal.     Mouth/Throat:     Mouth: Mucous membranes are moist.  Eyes:     Extraocular Movements: Extraocular movements intact.     Pupils: Pupils are equal, round, and reactive to light.  Cardiovascular:     Rate and Rhythm: Regular rhythm. Tachycardia present.  Pulmonary:     Effort: Pulmonary effort is normal.     Breath sounds: Normal breath sounds.  Abdominal:     General: Abdomen is flat. Bowel sounds are normal.     Palpations: Abdomen is soft.     Tenderness: There is no guarding or rebound.  Musculoskeletal:        General: Normal range of motion.     Cervical back: Normal range of motion.  Skin:    General: Skin is warm.     Capillary Refill: Capillary refill takes less than 2 seconds.  Neurological:     General: No focal deficit present.     Mental Status: She is alert.  Psychiatric:        Mood and Affect: Mood normal.        Behavior: Behavior normal.        Thought Content: Thought content normal.        Judgment: Judgment normal.          Assessment & Plan:  Vitamin D  deficiency Assessment & Plan: Ordered vitamin d  pending results.  Continue supplementation   Orders: -     VITAMIN D  25 Hydroxy (Vit-D Deficiency, Fractures)  Gingivitis  Primary  hypertension Assessment & Plan: Continue losartan  100 mg once daily Ordering urine m/a  Pt advised of the following:  Continue medication as prescribed. Monitor blood pressure periodically and/or when you feel symptomatic. Goal is <130/90 on average. Ensure that you have rested for 30 minutes prior to checking your blood pressure. Record your readings and bring them to your next visit if necessary.work on a low sodium diet.   Orders: -     Microalbumin / creatinine urine ratio -     EKG 12-Lead  Screening mammogram for breast cancer -     3D Screening Mammogram, Left and Right; Future  Encounter for general adult medical examination without abnormal findings Assessment & Plan: Patient Counseling(The following topics were reviewed):  Preventative care handout given to pt  Health maintenance and immunizations reviewed. Please refer to Health maintenance section. Pt advised on safe sex, wearing seatbelts in car, and proper nutrition labwork ordered today for annual Dental health: Discussed importance of regular tooth brushing, flossing, and dental visits.   Orders: -     Lipid panel -     Basic metabolic panel with GFR -     CBC  Hyperkalemia  Elevated heart rate with elevated blood pressure and diagnosis of hypertension Assessment & Plan: EKG in office today overall reassuring, NSR HR 88 bpm, 2 t wave inversions notied V1 and V2.  Discussed we could consider calcium  score to evaluate cardiovascular risk. Did advise we will maintain strict control of cholesterol levels goal LDL <70 continue atorvastatin  40 mg nightly  Advised pt to check blood pressure daily if average > 130/90 will consider adjunct to blood pressure medication    Positive colorectal cancer screening using Cologuard test Assessment & Plan: Urged pt to f/u for repeat colonoscopy  Currently asymptomatic        Follow-up: Return in about 6 months (around 08/20/2024) for f/u blood pressure.   Felicita Horns, FNP

## 2024-02-18 NOTE — Assessment & Plan Note (Signed)
 Continue losartan  100 mg once daily Ordering urine m/a  Pt advised of the following:  Continue medication as prescribed. Monitor blood pressure periodically and/or when you feel symptomatic. Goal is <130/90 on average. Ensure that you have rested for 30 minutes prior to checking your blood pressure. Record your readings and bring them to your next visit if necessary.work on a low sodium diet.

## 2024-02-18 NOTE — Assessment & Plan Note (Signed)

## 2024-02-18 NOTE — Patient Instructions (Signed)
  Recommendation for both tetanus (TDAP) and shingles vaccination.

## 2024-02-18 NOTE — Assessment & Plan Note (Addendum)
 EKG in office today overall reassuring, NSR HR 88 bpm, 2 t wave inversions notied V1 and V2.  Discussed we could consider calcium  score to evaluate cardiovascular risk. Did advise we will maintain strict control of cholesterol levels goal LDL <70 continue atorvastatin  40 mg nightly  Advised pt to check blood pressure daily if average > 130/90 will consider adjunct to blood pressure medication

## 2024-02-18 NOTE — Assessment & Plan Note (Signed)
 Urged pt to f/u for repeat colonoscopy  Currently asymptomatic

## 2024-03-25 DIAGNOSIS — M4306 Spondylolysis, lumbar region: Secondary | ICD-10-CM | POA: Diagnosis not present

## 2024-03-25 DIAGNOSIS — M9903 Segmental and somatic dysfunction of lumbar region: Secondary | ICD-10-CM | POA: Diagnosis not present

## 2024-03-25 DIAGNOSIS — M9905 Segmental and somatic dysfunction of pelvic region: Secondary | ICD-10-CM | POA: Diagnosis not present

## 2024-03-25 DIAGNOSIS — M4317 Spondylolisthesis, lumbosacral region: Secondary | ICD-10-CM | POA: Diagnosis not present

## 2024-05-26 ENCOUNTER — Encounter

## 2024-06-23 DIAGNOSIS — M9905 Segmental and somatic dysfunction of pelvic region: Secondary | ICD-10-CM | POA: Diagnosis not present

## 2024-06-23 DIAGNOSIS — M5417 Radiculopathy, lumbosacral region: Secondary | ICD-10-CM | POA: Diagnosis not present

## 2024-06-23 DIAGNOSIS — M9903 Segmental and somatic dysfunction of lumbar region: Secondary | ICD-10-CM | POA: Diagnosis not present

## 2024-06-23 DIAGNOSIS — M4306 Spondylolysis, lumbar region: Secondary | ICD-10-CM | POA: Diagnosis not present

## 2024-06-23 DIAGNOSIS — M4317 Spondylolisthesis, lumbosacral region: Secondary | ICD-10-CM | POA: Diagnosis not present

## 2024-06-23 DIAGNOSIS — M9904 Segmental and somatic dysfunction of sacral region: Secondary | ICD-10-CM | POA: Diagnosis not present

## 2024-07-07 ENCOUNTER — Encounter: Payer: Self-pay | Admitting: Family

## 2024-07-07 DIAGNOSIS — M9903 Segmental and somatic dysfunction of lumbar region: Secondary | ICD-10-CM | POA: Diagnosis not present

## 2024-07-07 DIAGNOSIS — M9904 Segmental and somatic dysfunction of sacral region: Secondary | ICD-10-CM | POA: Diagnosis not present

## 2024-07-07 DIAGNOSIS — M9905 Segmental and somatic dysfunction of pelvic region: Secondary | ICD-10-CM | POA: Diagnosis not present

## 2024-07-07 DIAGNOSIS — M5417 Radiculopathy, lumbosacral region: Secondary | ICD-10-CM | POA: Diagnosis not present

## 2024-07-08 NOTE — Telephone Encounter (Signed)
Can we please triage

## 2024-07-09 NOTE — Telephone Encounter (Signed)
 Noted

## 2024-07-09 NOTE — Telephone Encounter (Signed)
 I spoke with pt; pt said about 10 days ago when pt started a 1200 calorie diet pt noticed her BP on the lower side 115/63 and pt on ands off feels lightheaded and blurry Not herself.Now pt feels fine; no H/A,Dizziness, CP or SOB. N ow BP 137/78 P 85. Offered pt appt but pt said she is feeling better and does not want  appt at this time. Pt will cb if appt needed. UC & ED precautions given and pt voiced understanding. Sendfing note to ONEIDA Patrick FNP who is out of office and M. Cable NP who is in office.

## 2024-07-21 DIAGNOSIS — M9905 Segmental and somatic dysfunction of pelvic region: Secondary | ICD-10-CM | POA: Diagnosis not present

## 2024-07-21 DIAGNOSIS — M9903 Segmental and somatic dysfunction of lumbar region: Secondary | ICD-10-CM | POA: Diagnosis not present

## 2024-07-21 DIAGNOSIS — M5417 Radiculopathy, lumbosacral region: Secondary | ICD-10-CM | POA: Diagnosis not present

## 2024-07-21 DIAGNOSIS — M9904 Segmental and somatic dysfunction of sacral region: Secondary | ICD-10-CM | POA: Diagnosis not present

## 2024-08-05 ENCOUNTER — Ambulatory Visit
Admission: RE | Admit: 2024-08-05 | Discharge: 2024-08-05 | Disposition: A | Discharge status: Home / Self Care | Source: Ambulatory Visit | Attending: Family | Admitting: Family

## 2024-08-05 DIAGNOSIS — Z1231 Encounter for screening mammogram for malignant neoplasm of breast: Secondary | ICD-10-CM | POA: Insufficient documentation

## 2024-08-07 ENCOUNTER — Encounter: Payer: Self-pay | Admitting: Pharmacist

## 2024-08-07 NOTE — Progress Notes (Signed)
 Pharmacy Quality Measure Review  This patient is appearing on a report for being at risk of failing the Controlling Blood Pressure measure this calendar year.   Last documented BP  BP Readings from Last 1 Encounters:  02/18/24 (!) 146/105   2025 Follow up scheduled: YES Encounter note placed. Ensure BP resting >31min. Recheck if higher than 139/79

## 2024-08-08 ENCOUNTER — Other Ambulatory Visit: Payer: Self-pay | Admitting: Family

## 2024-08-08 DIAGNOSIS — E782 Mixed hyperlipidemia: Secondary | ICD-10-CM

## 2024-08-18 DIAGNOSIS — M9903 Segmental and somatic dysfunction of lumbar region: Secondary | ICD-10-CM | POA: Diagnosis not present

## 2024-08-18 DIAGNOSIS — M5417 Radiculopathy, lumbosacral region: Secondary | ICD-10-CM | POA: Diagnosis not present

## 2024-08-18 DIAGNOSIS — M9904 Segmental and somatic dysfunction of sacral region: Secondary | ICD-10-CM | POA: Diagnosis not present

## 2024-08-18 DIAGNOSIS — M9905 Segmental and somatic dysfunction of pelvic region: Secondary | ICD-10-CM | POA: Diagnosis not present

## 2024-08-19 ENCOUNTER — Ambulatory Visit: Admitting: Family

## 2024-08-24 ENCOUNTER — Ambulatory Visit: Admitting: Family

## 2024-09-02 ENCOUNTER — Encounter: Payer: Self-pay | Admitting: Family

## 2024-09-02 ENCOUNTER — Ambulatory Visit: Admitting: Family

## 2024-09-02 VITALS — BP 164/105 | HR 84 | Temp 98.0°F | Ht 64.0 in | Wt 218.8 lb

## 2024-09-02 DIAGNOSIS — E559 Vitamin D deficiency, unspecified: Secondary | ICD-10-CM

## 2024-09-02 DIAGNOSIS — I10 Essential (primary) hypertension: Secondary | ICD-10-CM

## 2024-09-02 DIAGNOSIS — R450 Nervousness: Secondary | ICD-10-CM

## 2024-09-02 DIAGNOSIS — R195 Other fecal abnormalities: Secondary | ICD-10-CM

## 2024-09-02 DIAGNOSIS — E782 Mixed hyperlipidemia: Secondary | ICD-10-CM

## 2024-09-02 LAB — LIPID PANEL
Cholesterol: 177 mg/dL (ref 0–200)
HDL: 64.2 mg/dL (ref >39.00)
LDL Cholesterol: 93 mg/dL (ref 0–99)
NonHDL: 112.96
Total CHOL/HDL Ratio: 3
Triglycerides: 102 mg/dL (ref 0.0–149.0)
VLDL: 20.4 mg/dL (ref 0.0–40.0)

## 2024-09-02 LAB — CBC
HCT: 39.5 % (ref 36.0–46.0)
Hemoglobin: 13.7 g/dL (ref 12.0–15.0)
MCHC: 34.7 g/dL (ref 30.0–36.0)
MCV: 96.7 fl (ref 78.0–100.0)
Platelets: 195 K/uL (ref 150.0–400.0)
RBC: 4.08 Mil/uL (ref 3.87–5.11)
RDW: 14.8 % (ref 11.5–15.5)
WBC: 6.6 K/uL (ref 4.0–10.5)

## 2024-09-02 LAB — COMPREHENSIVE METABOLIC PANEL WITH GFR
ALT: 17 U/L (ref 0–35)
AST: 20 U/L (ref 0–37)
Albumin: 4 g/dL (ref 3.5–5.2)
Alkaline Phosphatase: 72 U/L (ref 39–117)
BUN: 9 mg/dL (ref 6–23)
CO2: 30 meq/L (ref 19–32)
Calcium: 9 mg/dL (ref 8.4–10.5)
Chloride: 105 meq/L (ref 96–112)
Creatinine, Ser: 0.59 mg/dL (ref 0.40–1.20)
GFR: 92.25 mL/min (ref >60.00)
Glucose, Bld: 87 mg/dL (ref 70–99)
Potassium: 4.8 meq/L (ref 3.5–5.1)
Sodium: 140 meq/L (ref 135–145)
Total Bilirubin: 0.7 mg/dL (ref 0.2–1.2)
Total Protein: 6.9 g/dL (ref 6.0–8.3)

## 2024-09-02 LAB — VITAMIN D 25 HYDROXY (VIT D DEFICIENCY, FRACTURES): VITD: 52.12 ng/mL (ref 30.00–100.00)

## 2024-09-02 LAB — TSH: TSH: 1.73 u[IU]/mL (ref 0.35–5.50)

## 2024-09-02 MED ORDER — AMLODIPINE BESYLATE 5 MG PO TABS: 5.0000 mg | ORAL_TABLET | Freq: Every day | ORAL | 0 refills | Status: DC

## 2024-09-02 NOTE — Progress Notes (Unsigned)
 Established Patient Office Visit  Subjective:      CC:  Chief Complaint  Patient presents with   Hypertension    HPI: Evelyn Bell is a 68 y.o. female presenting on 09/02/2024 for Hypertension .  Discussed the use of AI scribe software for clinical note transcription with the patient, who gave verbal consent to proceed.  History of Present Illness Evelyn Bell is a 68 year old female with hypertension who presents with elevated blood pressure and jittery sensation.  She experiences episodes of elevated blood pressure, particularly waking up at night feeling jittery, similar to the sensation after excessive caffeine intake. During these episodes, she reports feeling jittery and that her blood pressure was elevated. She monitors her blood pressure at home, which is generally stable except for one previous low reading.  Her blood pressure typically ranges from 135 to 140 mmHg systolic, with diastolic in the low 80s. She is on losartan  100 mg daily. She previously experienced a cough with lisinopril. No recent caffeine intake before the episode, but she had regular espresso coffee on the morning of the visit.  She has a history of a positive Cologuard test and a colonoscopy in January, with no current issues of blood in stool or hemorrhoids. She is on atorvastatin  40 mg for cholesterol management and supplements with vitamin D , possibly at a higher dose than 2000 IU. She reports a weight gain of seven pounds over the past year, attributing it to seasonal dietary habits.  No chest pain, shortness of breath, heartburn, acid reflux, leg swelling, heart palpitations, skipped beats, or racing heart. She confirms regular water intake.         Social history:  Relevant past medical, surgical, family and social history reviewed and updated as indicated. Interim medical history since our last visit reviewed.  Allergies and medications reviewed and updated.  DATA REVIEWED:  CHART IN EPIC     ROS: Negative unless specifically indicated above in HPI.    Current Outpatient Medications:    amLODipine  (NORVASC ) 5 MG tablet, Take 1 tablet (5 mg total) by mouth daily., Disp: 90 tablet, Rfl: 0   atorvastatin  (LIPITOR) 40 MG tablet, TAKE 1 TABLET BY MOUTH EVERY DAY, Disp: 90 tablet, Rfl: 3   cholecalciferol (VITAMIN D3) 25 MCG (1000 UNIT) tablet, Take 1,000 Units by mouth daily., Disp: , Rfl:    losartan  (COZAAR ) 100 MG tablet, TAKE 1 TABLET BY MOUTH EVERY DAY, Disp: 90 tablet, Rfl: 3        Objective:        BP (!) 164/105   Pulse 84   Temp 98 F (36.7 C) (Temporal)   Ht 5' 4 (1.626 m)   Wt 218 lb 12.8 oz (99.2 kg)   SpO2 98%   BMI 37.56 kg/m   Physical Exam VITALS: P- 87, BP- 165/105  Wt Readings from Last 3 Encounters:  09/02/24 218 lb 12.8 oz (99.2 kg)  02/18/24 213 lb (96.6 kg)  08/19/23 211 lb 9.6 oz (96 kg)     Wt Readings from Last 3 Encounters:  09/02/24 218 lb 12.8 oz (99.2 kg)  02/18/24 213 lb (96.6 kg)  08/19/23 211 lb 9.6 oz (96 kg)    Physical Exam Constitutional:      General: She is not in acute distress.    Appearance: Normal appearance. She is obese. She is not ill-appearing, toxic-appearing or diaphoretic.  HENT:     Head: Normocephalic.  Cardiovascular:     Rate and Rhythm: Normal  rate and regular rhythm.  Pulmonary:     Effort: Pulmonary effort is normal.     Breath sounds: Normal breath sounds.  Musculoskeletal:        General: Normal range of motion.     Right lower leg: No edema.     Left lower leg: No edema.  Neurological:     General: No focal deficit present.     Mental Status: She is alert and oriented to person, place, and time. Mental status is at baseline.  Psychiatric:        Mood and Affect: Mood normal.        Behavior: Behavior normal.        Thought Content: Thought content normal.        Judgment: Judgment normal.          Results LABS Urine microalbumin:  Negative  DIAGNOSTIC Colonoscopy: Positive Cologuard (January 2025) EKG: Normal sinus rhythm, T wave inversion, heart rate 88 bpm (01/2024)  Assessment & Plan:   Assessment and Plan Assessment & Plan Primary hypertension Blood pressure is consistently elevated, with recent readings of 165/105 mmHg. Home readings range from 135-140/80s mmHg. No chest pain, shortness of breath, or palpitations. Elevated blood pressure may contribute to cardiovascular strain over time. Current medication is losartan  100 mg daily. Consideration of adding amlodipine  due to persistent elevation. Discussed the importance of monitoring blood pressure and heart rate. - Start amlodipine  5 mg once daily in addition to losartan  100 mg daily. - Monitor blood pressure and heart rate at home, recording readings an hour or two after taking medication. - Advised to reduce caffeine intake, consider switching to decaf espresso. - Ordered labs to check liver function, cholesterol, kidney function, and thyroid  function. - Advised to watch salt intake and maintain hydration with 64-88 ounces of water daily.  Mixed hyperlipidemia Currently on atorvastatin  40 mg daily. Cholesterol levels need to be maintained below 70 mg/dL. Recent dietary habits include increased junk food intake, which may affect cholesterol levels. - Continue atorvastatin  40 mg daily. - Ordered cholesterol levels as part of lab work.  Vitamin D  deficiency Currently supplementing with cholecalciferol 25 mcg daily. Previous vitamin D  levels were between 40-60 ng/mL, which is acceptable. High levels can cause cardiac issues. - Ordered vitamin D  level as part of lab work.  General health maintenance Due for routine lab work to monitor liver function, cholesterol, kidney function, and thyroid  function. Recent weight gain of 7 pounds over the past year, likely due to increased junk food intake during the holiday season. - Ordered routine lab work including liver  function, cholesterol, kidney function, and thyroid  function. - Advised on weight management and dietary modifications to reduce junk food intake.        Return in about 3 months (around 12/01/2024) for f/u blood pressure.     Ginger Patrick, MSN, APRN, FNP-C [facility]

## 2024-09-07 ENCOUNTER — Ambulatory Visit: Payer: Self-pay | Admitting: Family

## 2024-09-15 MED ORDER — AMLODIPINE BESYLATE 10 MG PO TABS: 10.0000 mg | ORAL_TABLET | Freq: Every day | ORAL | 1 refills | Status: DC

## 2024-10-13 ENCOUNTER — Other Ambulatory Visit: Payer: Self-pay | Admitting: Family

## 2024-12-02 ENCOUNTER — Ambulatory Visit: Admitting: Family
